# Patient Record
Sex: Male | Born: 1970
Health system: Southern US, Community
[De-identification: ages and names within clinical notes are randomized; demographics above are authoritative.]

## PROBLEM LIST (undated history)

## (undated) DIAGNOSIS — E7800 Pure hypercholesterolemia, unspecified: Secondary | ICD-10-CM

## (undated) HISTORY — PX: APPENDECTOMY: SHX54

---

## 2009-05-27 ENCOUNTER — Encounter: Payer: Self-pay | Admitting: Family Medicine

## 2009-05-27 ENCOUNTER — Ambulatory Visit: Payer: Self-pay | Admitting: Family Medicine

## 2009-05-27 DIAGNOSIS — J029 Acute pharyngitis, unspecified: Secondary | ICD-10-CM | POA: Insufficient documentation

## 2009-05-27 LAB — CONVERTED CEMR LAB: Rapid Strep: NEGATIVE

## 2010-07-17 NOTE — Assessment & Plan Note (Signed)
Summary: sore throat,cough,fatigue   Vital Signs:  Patient Profile:   40 Years Old Male CC:      cough, sorethroat and fatigue O2 Sat:      99 % O2 treatment:    Room Air Temp:     98.5 degrees F oral Pulse rate:   97 / minute Pulse rhythm:   regular Resp:     16 per minute BP sitting:   124 / 78  (right arm)  Vitals Entered By: Lannie Fields (May 27, 2009 2:40 PM)                  Updated Prior Medication List: No Medications Current Allergies: No known allergies History of Present Illness Chief Complaint: cough, sorethroat and fatigue History of Present Illness: Subjective: Patient complains of sore throat that started this morning.  He noticed that his uvula is slightly swollen + slight cough No pleuritic pain No wheezing No nasal congestion No post-nasal drainage No sinus pain/pressure No itchy/red eyes No earache No hemoptysis No SOB No fever, + chills No nausea No vomiting No abdominal pain No diarrhea No skin rashes + fatigue No myalgias No headache    REVIEW OF SYSTEMS Constitutional Symptoms       Complains of chills and fatigue.     Denies fever, night sweats, weight loss, and weight gain.  Eyes       Denies change in vision, eye pain, eye discharge, glasses, contact lenses, and eye surgery. Ear/Nose/Throat/Mouth       Complains of sore throat and hoarseness.      Denies hearing loss/aids, change in hearing, ear pain, ear discharge, dizziness, frequent runny nose, frequent nose bleeds, sinus problems, and tooth pain or bleeding.  Respiratory       Complains of dry cough.      Denies productive cough, wheezing, shortness of breath, asthma, bronchitis, and emphysema/COPD.  Cardiovascular       Denies murmurs, chest pain, and tires easily with exhertion.    Gastrointestinal       Denies stomach pain, nausea/vomiting, diarrhea, constipation, blood in bowel movements, and indigestion. Genitourniary       Denies painful urination, kidney  stones, and loss of urinary control. Neurological       Complains of headaches.      Denies paralysis, seizures, and fainting/blackouts. Musculoskeletal       Denies muscle pain, joint pain, joint stiffness, decreased range of motion, redness, swelling, muscle weakness, and gout.  Skin       Denies bruising, unusual mles/lumps or sores, and hair/skin or nail changes.  Psych       Denies mood changes, temper/anger issues, anxiety/stress, speech problems, depression, and sleep problems.  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  Extraocular movement is intact.  Conjunctivae are not inflamed.  Ears:  Canals normal.  Tympanic membranes normal.   Nose:  Normal septum.  Normal turbinates, mildly congested.   No sinus tenderness present.  Pharynx:  Mildly erythematous.  No exudate Neck:  Supple.  Slightly tender shotty anterior/posterior nodes are palpated bilaterally.  Lungs:  Clear to auscultation.  Breath sounds are equal.  Heart:  Regular rate and rhythm without murmurs, rubs, or gallops.  Abdomen:  Nontender without masses or hepatosplenomegaly.  Bowel sounds are present.  No CVA or flank tenderness.  Skin:  No rash Rapid strep test negative  Assessment New Problems: ACUTE PHARYNGITIS (ICD-462)  ? non group A strep vs early viral URI  Plan New Medications/Changes: AZITHROMYCIN 250 MG TABS (AZITHROMYCIN) Two tabs by mouth on day 1, then 1 tab daily on days 2 through 5  #6 tabs x 0, 05/27/2009, Donna Christen MD  New Orders: New Patient Level III [99203] Rapid Strep [87880] T-Culture, Throat  [[id number]] Planning Comments:   Throat culture pending.  If positive, begin Z-pack.  Treat symptomatically for now with Aleve, salt water gargles   The patient and/or caregiver has been counseled thoroughly with regard to medications prescribed including dosage, schedule, interactions, rationale for use, and possible side effects and they verbalize understanding.  Diagnoses and expected course of recovery discussed and will return if not improved as expected or if the condition worsens. Patient and/or caregiver verbalized understanding.  Prescriptions: AZITHROMYCIN 250 MG TABS (AZITHROMYCIN) Two tabs by mouth on day 1, then 1 tab daily on days 2 through 5  #6 tabs x 0   Entered and Authorized by:   Donna Christen MD   Signed by:   Donna Christen MD on 05/27/2009   Method used:   Print then Give to Patient   RxID:   [id number]   Patient Instructions: 1)  May use Mucinex D (guaifenesin with decongestant) twice daily for congestion. 2)  May take 2 Aleve tabs every 12 hours. 3)  Increase fluid intake, rest. 4)  May use Afrin nasal spray (or generic oxymetazoline) twice daily for about 5 days.  Also recommend using saline nasal spray several times daily and/or saline nasal irrigation. 5)  Followup with family doctor if not improving 7 to 10 days  Laboratory Results  Date/Time Received: Lannie Fields  May 27, 2009 3:18 PM  Date/Time Reported: Lannie Fields  May 27, 2009 3:18 PM   Other Tests  Rapid Strep: negative  Kit Test Internal QC: Negative   (Normal Range: Negative)

## 2010-07-17 NOTE — Letter (Signed)
Summary: Handout Printed  Printed Handout:  - Rheumatic Fever 

## 2011-06-10 ENCOUNTER — Encounter: Payer: Self-pay | Admitting: *Deleted

## 2011-06-10 ENCOUNTER — Emergency Department
Admission: EM | Admit: 2011-06-10 | Discharge: 2011-06-10 | Disposition: A | Discharge status: Home / Self Care | Payer: 59 | Source: Home / Self Care | Attending: Emergency Medicine | Admitting: Emergency Medicine

## 2011-06-10 DIAGNOSIS — J209 Acute bronchitis, unspecified: Secondary | ICD-10-CM

## 2011-06-10 MED ORDER — CEFTRIAXONE SODIUM 1 G IJ SOLR
1.0000 g | INTRAMUSCULAR | Status: AC
  Administered 2011-06-10: 1 g via INTRAMUSCULAR

## 2011-06-10 MED ORDER — LEVOFLOXACIN 500 MG PO TABS: ORAL_TABLET | ORAL | Status: AC

## 2011-06-10 NOTE — ED Provider Notes (Signed)
History     CSN: [id number]  Arrival date & time 06/10/11  1024   First MD Initiated Contact with Patient 06/10/11 1126      Chief Complaint  Patient presents with  . Cough  . URI    HPI History of present illness:  4 days of worsening congestion and cough, associated with discolored mucus and fever. Has tried over-the-counter treatments without significant improvement.  Cough is worsening, productive of yellow-green sputum. Rarely has wheezing, not today. The fever chills or night sweats or worsening. Worsening malaise, without focal neurologic symptoms.  Review of systems: No significant sore throat.  No dyspnea. No abdominal pain, nausea or vomiting. No GU symptoms. No new rash. No syncope or focal neurologic symptoms. History reviewed. No pertinent past medical history.  History reviewed. No pertinent past surgical history.  History reviewed. No pertinent family history.  History  Substance Use Topics  . Smoking status: Never Smoker   . Smokeless tobacco: Not on file  . Alcohol Use: Yes      Review of Systems  HENT: Positive for congestion and rhinorrhea. Negative for nosebleeds and neck pain.   Eyes: Negative.   Respiratory: Negative for shortness of breath.   Cardiovascular: Negative.   Gastrointestinal: Negative.   Genitourinary: Negative.   Musculoskeletal: Negative.   Neurological: Negative.   Hematological: Negative.   Psychiatric/Behavioral: Negative.     Allergies  Review of patient's allergies indicates no known allergies.  Home Medications   Current Outpatient Rx  Name Route Sig Dispense Refill  . LEVOFLOXACIN 500 MG PO TABS  Take 1 tablet daily for 10 days. 10 tablet 0    BP 128/83  Pulse 111  Temp(Src) 99.8 F (37.7 C) (Oral)  Resp 16  Ht 6' (1.829 m)  Wt 218 lb (98.884 kg)  BMI 29.57 kg/m2  SpO2 98%  Physical Exam  Nursing note and vitals reviewed. Constitutional: He is oriented to person, place, and time. He appears  well-developed and well-nourished.  Non-toxic appearance. He appears ill. No distress.  HENT:  Head: Normocephalic and atraumatic.  Right Ear: Tympanic membrane normal.  Left Ear: Tympanic membrane normal.  Nose: Nose normal.  Mouth/Throat: Oropharynx is clear and moist. No oropharyngeal exudate.  Eyes: Right eye exhibits no discharge. Left eye exhibits no discharge. No scleral icterus.  Neck: Neck supple.  Cardiovascular: Normal rate, regular rhythm and normal heart sounds.   Pulmonary/Chest: No respiratory distress. He has no wheezes. He has rhonchi. He has no rales.  Lymphadenopathy:    He has no cervical adenopathy.  Neurological: He is alert and oriented to person, place, and time.  Skin: Skin is warm and dry.    ED Course  Procedures (including critical care time)   1. Bronchitis, acute    MDM  Workup and treatment options discussed. He declined a chest x-ray, and I agree that he has no findings compatible with pneumonia. After risks, benefits, alternatives discussed, he preferred to start aggressive treatment with 1 g Rocephin IM now. Levaquin by mouth also prescribed. Other symptomatic care discussed. See detailed Instructions in AVS, which were given to patient. Verbal instructions also given. Questions invited and answered.        Lonell Face, MD 06/10/11 2030

## 2011-06-10 NOTE — ED Notes (Signed)
Patient c/o cough, congestion and sore throat x 4 days. He did not receive a flu shot this year.

## 2014-10-11 ENCOUNTER — Encounter: Payer: Self-pay | Admitting: Physician Assistant

## 2014-10-11 ENCOUNTER — Ambulatory Visit (INDEPENDENT_AMBULATORY_CARE_PROVIDER_SITE_OTHER): Payer: BLUE CROSS/BLUE SHIELD | Admitting: Physician Assistant

## 2014-10-11 VITALS — BP 132/90 | HR 87 | Temp 98.4°F | Resp 20 | Ht 71.0 in | Wt 228.0 lb

## 2014-10-11 DIAGNOSIS — Z13220 Encounter for screening for lipoid disorders: Secondary | ICD-10-CM | POA: Diagnosis not present

## 2014-10-11 DIAGNOSIS — Z0000 Encounter for general adult medical examination without abnormal findings: Secondary | ICD-10-CM | POA: Diagnosis not present

## 2014-10-11 DIAGNOSIS — E6609 Other obesity due to excess calories: Secondary | ICD-10-CM | POA: Insufficient documentation

## 2014-10-11 DIAGNOSIS — Z131 Encounter for screening for diabetes mellitus: Secondary | ICD-10-CM

## 2014-10-11 DIAGNOSIS — Z6832 Body mass index (BMI) 32.0-32.9, adult: Secondary | ICD-10-CM

## 2014-10-11 DIAGNOSIS — Z23 Encounter for immunization: Secondary | ICD-10-CM

## 2014-10-11 DIAGNOSIS — J302 Other seasonal allergic rhinitis: Secondary | ICD-10-CM

## 2014-10-11 DIAGNOSIS — E669 Obesity, unspecified: Secondary | ICD-10-CM

## 2014-10-11 DIAGNOSIS — Z6833 Body mass index (BMI) 33.0-33.9, adult: Secondary | ICD-10-CM | POA: Insufficient documentation

## 2014-10-11 DIAGNOSIS — R635 Abnormal weight gain: Secondary | ICD-10-CM | POA: Diagnosis not present

## 2014-10-11 NOTE — Progress Notes (Signed)
Subjective:    Patient ID: Jonathan Shaw, male    DOB: Aug 23, 1970, 44 y.o.   MRN: [id number]  HPI Patient is a 44 year old male who presents to the clinic to establish care. 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  .. Active Ambulatory Problems    Diagnosis Date Noted  . Seasonal allergies 10/11/2014  . Obesity 10/11/2014  . Abnormal weight gain 10/11/2014   Resolved Ambulatory Problems    Diagnosis Date Noted  . Acute pharyngitis 05/27/2009   No Additional Past Medical History   Unknown family hx  .Marland Kitchen History   Social History  . Marital Status: Married    Spouse Name: N/A  . Number of Children: N/A  . Years of Education: N/A   Occupational History  . Not on file.   Social History Main Topics  . Smoking status: Never Smoker   . Smokeless tobacco: Not on file  . Alcohol Use: Yes  . Drug Use: No  . Sexual Activity: Not on file   Other Topics Concern  . Not on file   Social History Narrative      Review of Systems  All other systems reviewed and are negative.      Objective:   Physical Exam BP 132/90 mmHg  Pulse 87  Temp(Src) 98.4 F (36.9 C) (Oral)  Resp 20  Ht 5\' 11"  (1.803 m)  Wt 228 lb (103.42 kg)  BMI 31.81 kg/m2  SpO2 94%  General Appearance:    Alert, cooperative, no distress, appears stated age  Head:    Normocephalic, without obvious abnormality, atraumatic  Eyes:    PERRL, conjunctiva/corneas clear, EOM's intact, fundi    benign, both eyes       Ears:    Normal TM's and external ear canals, both ears  Nose:   Nares normal, septum midline, mucosa normal, no drainage    or sinus tenderness  Throat:   Lips, mucosa, and tongue normal; teeth and gums normal  Neck:   Supple, symmetrical, trachea midline, no adenopathy;       thyroid:  No enlargement/tenderness/nodules; no carotid   bruit or JVD  Back:     Symmetric, no curvature, ROM normal, no CVA tenderness  Lungs:     Clear to auscultation  bilaterally, respirations unlabored  Chest wall:    No tenderness or deformity  Heart:    Regular rate and rhythm, S1 and S2 normal, no murmur, rub   or gallop  Abdomen:     Soft, non-tender, bowel sounds active all four quadrants,    no masses, no organomegaly  Genitalia:  Not done.   Rectal:  Not done.   Extremities:   Extremities normal, atraumatic, no cyanosis or edema  Pulses:   2+ and symmetric all extremities  Skin:   Skin color, texture, turgor normal, no rashes or lesions  Lymph nodes:   Cervical, supraclavicular, and axillary nodes normal  Neurologic:   CNII-XII intact. Normal strength, sensation and reflexes      throughout         Assessment & Plan:  cpe- Tdap given today. Lipid and cmp ordered. No vaccines are needed at this time. Handout was given. Patient is a 32 on a multivitamin. AUA testing was 1 today. Follow-up as needed.  Obesity abnormal weight gain- certainly discussed healthy diet and exercise. Patient does appear to exercise regularly. There are medicines that can help with patient isn't interested. Patient admits  to living his carbs. Encouraged to add more protein to his diet. Will order a TSH just to make sure his thyroid function is normal.

## 2014-10-11 NOTE — Patient Instructions (Signed)

## 2014-10-12 LAB — COMPLETE METABOLIC PANEL WITH GFR
ALT: 26 U/L (ref 0–53)
AST: 19 U/L (ref 0–37)
Albumin: 4.7 g/dL (ref 3.5–5.2)
Alkaline Phosphatase: 76 U/L (ref 39–117)
BUN: 10 mg/dL (ref 6–23)
CO2: 22 mEq/L (ref 19–32)
Calcium: 9.7 mg/dL (ref 8.4–10.5)
Chloride: 101 mEq/L (ref 96–112)
Creat: 0.78 mg/dL (ref 0.50–1.35)
GFR, Est African American: >89 mL/min
GFR, Est Non African American: >89 mL/min
Glucose, Bld: 97 mg/dL (ref 70–99)
Potassium: 4.7 mEq/L (ref 3.5–5.3)
Sodium: 137 mEq/L (ref 135–145)
Total Bilirubin: 0.7 mg/dL (ref 0.2–1.2)
Total Protein: 7.4 g/dL (ref 6.0–8.3)

## 2014-10-12 LAB — LIPID PANEL
Cholesterol: 236 mg/dL — ABNORMAL HIGH (ref 0–200)
HDL: 41 mg/dL (ref >=40)
LDL Cholesterol: 162 mg/dL — ABNORMAL HIGH (ref 0–99)
Total CHOL/HDL Ratio: 5.8 Ratio
Triglycerides: 167 mg/dL — ABNORMAL HIGH (ref <150)
VLDL: 33 mg/dL (ref 0–40)

## 2014-10-12 LAB — TSH: TSH: 1.11 u[IU]/mL (ref 0.350–4.500)

## 2014-10-14 ENCOUNTER — Encounter: Payer: Self-pay | Admitting: Physician Assistant

## 2014-10-14 DIAGNOSIS — E785 Hyperlipidemia, unspecified: Secondary | ICD-10-CM | POA: Insufficient documentation

## 2015-10-11 ENCOUNTER — Encounter: Payer: Self-pay | Admitting: Physician Assistant

## 2015-10-11 ENCOUNTER — Ambulatory Visit (INDEPENDENT_AMBULATORY_CARE_PROVIDER_SITE_OTHER): Payer: BLUE CROSS/BLUE SHIELD | Admitting: Physician Assistant

## 2015-10-11 VITALS — BP 136/80 | HR 92 | Wt 237.0 lb

## 2015-10-11 DIAGNOSIS — Z131 Encounter for screening for diabetes mellitus: Secondary | ICD-10-CM | POA: Diagnosis not present

## 2015-10-11 DIAGNOSIS — E669 Obesity, unspecified: Secondary | ICD-10-CM

## 2015-10-11 DIAGNOSIS — Z0000 Encounter for general adult medical examination without abnormal findings: Secondary | ICD-10-CM

## 2015-10-11 DIAGNOSIS — Z114 Encounter for screening for human immunodeficiency virus [HIV]: Secondary | ICD-10-CM | POA: Diagnosis not present

## 2015-10-11 DIAGNOSIS — E785 Hyperlipidemia, unspecified: Secondary | ICD-10-CM | POA: Diagnosis not present

## 2015-10-11 NOTE — Progress Notes (Signed)
   Subjective:    Patient ID: Jonathan Shaw, male    DOB: June 03, 1971, 45 y.o.   MRN: [id number]  HPI Pt is a 45 yo male who presents to the clinic for CPE. No problems. He is concerned with his weight. He is walking and very active. He certainly does not watch his diet. He admits to eating a lot of sweets.   .. Active Ambulatory Problems    Diagnosis Date Noted  . Seasonal allergies 10/11/2014  . Obesity 10/11/2014  . Abnormal weight gain 10/11/2014  . Hyperlipidemia 10/14/2014   Resolved Ambulatory Problems    Diagnosis Date Noted  . Acute pharyngitis 05/27/2009   No Additional Past Medical History   .Marland Kitchen Social History   Social History  . Marital Status: Married    Spouse Name: N/A  . Number of Children: N/A  . Years of Education: N/A   Occupational History  . Not on file.   Social History Main Topics  . Smoking status: Never Smoker   . Smokeless tobacco: Not on file  . Alcohol Use: Yes  . Drug Use: No  . Sexual Activity: Not on file   Other Topics Concern  . Not on file   Social History Narrative      Review of Systems  All other systems reviewed and are negative.      Objective:   Physical Exam BP 136/80 mmHg  Pulse 92  Wt 237 lb (107.502 kg)  SpO2 97%  General Appearance:    Alert, cooperative, no distress, appears stated age  Head:    Normocephalic, without obvious abnormality, atraumatic  Eyes:    PERRL, conjunctiva/corneas clear, EOM's intact, fundi    benign, both eyes       Ears:    Normal TM's and external ear canals, both ears  Nose:   Nares normal, septum midline, mucosa normal, no drainage    or sinus tenderness  Throat:   Lips, mucosa, and tongue normal; teeth and gums normal  Neck:   Supple, symmetrical, trachea midline, no adenopathy;       thyroid:  No enlargement/tenderness/nodules; no carotid   bruit or JVD  Back:     Symmetric, no curvature, ROM normal, no CVA tenderness  Lungs:     Clear to auscultation bilaterally,  respirations unlabored  Chest wall:    No tenderness or deformity  Heart:    Regular rate and rhythm, S1 and S2 normal, no murmur, rub   or gallop  Abdomen:     Soft, non-tender, bowel sounds active all four quadrants,    no masses, no organomegaly  Genitalia:   Not done.   Rectal:  Not done.   Extremities:   Extremities normal, atraumatic, no cyanosis or edema  Pulses:   2+ and symmetric all extremities  Skin:   Skin color, texture, turgor normal, no rashes or lesions  Lymph nodes:   Cervical, supraclavicular, and axillary nodes normal  Neurologic:   CNII-XII intact. Normal strength, sensation and reflexes      throughout         Assessment & Plan:  CPE/hyperlipidemia- lipid and cmp ordered. TSH and PSA ordered. AUA was 0. See weight discussion. HIV screening ordered.   Obesity- encouraged diet changes. Discussed medication options. Pt declined any help today.

## 2015-10-11 NOTE — Patient Instructions (Signed)

## 2015-10-12 LAB — PSA: PSA: 1.05 ng/mL (ref <=4.00)

## 2015-10-12 LAB — LIPID PANEL
Cholesterol: 232 mg/dL — ABNORMAL HIGH (ref 125–200)
HDL: 39 mg/dL — ABNORMAL LOW (ref >=40)
LDL Cholesterol: 158 mg/dL — ABNORMAL HIGH (ref <130)
Total CHOL/HDL Ratio: 5.9 Ratio — ABNORMAL HIGH (ref <=5.0)
Triglycerides: 176 mg/dL — ABNORMAL HIGH (ref <150)
VLDL: 35 mg/dL — ABNORMAL HIGH (ref <30)

## 2015-10-12 LAB — COMPLETE METABOLIC PANEL WITH GFR
ALT: 32 U/L (ref 9–46)
AST: 24 U/L (ref 10–40)
Albumin: 4.8 g/dL (ref 3.6–5.1)
Alkaline Phosphatase: 66 U/L (ref 40–115)
BUN: 12 mg/dL (ref 7–25)
CO2: 25 mmol/L (ref 20–31)
Calcium: 10 mg/dL (ref 8.6–10.3)
Chloride: 101 mmol/L (ref 98–110)
Creat: 0.87 mg/dL (ref 0.60–1.35)
GFR, Est African American: >89 mL/min (ref >=60)
GFR, Est Non African American: >89 mL/min (ref >=60)
Glucose, Bld: 99 mg/dL (ref 65–99)
Potassium: 4.4 mmol/L (ref 3.5–5.3)
Sodium: 138 mmol/L (ref 135–146)
Total Bilirubin: 1.2 mg/dL (ref 0.2–1.2)
Total Protein: 7.4 g/dL (ref 6.1–8.1)

## 2015-10-12 LAB — TSH: TSH: 1.53 mIU/L (ref 0.40–4.50)

## 2015-10-13 ENCOUNTER — Encounter: Payer: Self-pay | Admitting: Physician Assistant

## 2015-10-13 DIAGNOSIS — E785 Hyperlipidemia, unspecified: Secondary | ICD-10-CM | POA: Insufficient documentation

## 2015-10-13 LAB — HIV ANTIBODY (ROUTINE TESTING W REFLEX): HIV 1&2 Ab, 4th Generation: NONREACTIVE

## 2015-10-16 ENCOUNTER — Other Ambulatory Visit: Payer: Self-pay | Admitting: *Deleted

## 2015-10-16 MED ORDER — ATORVASTATIN CALCIUM 40 MG PO TABS: 40.0000 mg | ORAL_TABLET | Freq: Every day | ORAL | Status: DC

## 2015-10-16 MED ORDER — PHENTERMINE HCL 37.5 MG PO CAPS: 37.5000 mg | ORAL_CAPSULE | ORAL | Status: DC

## 2015-11-21 ENCOUNTER — Ambulatory Visit (INDEPENDENT_AMBULATORY_CARE_PROVIDER_SITE_OTHER): Payer: BLUE CROSS/BLUE SHIELD | Admitting: Physician Assistant

## 2015-11-21 ENCOUNTER — Encounter: Payer: Self-pay | Admitting: Physician Assistant

## 2015-11-21 VITALS — BP 123/90 | HR 90 | Wt 229.0 lb

## 2015-11-21 DIAGNOSIS — M79673 Pain in unspecified foot: Secondary | ICD-10-CM | POA: Diagnosis not present

## 2015-11-21 DIAGNOSIS — E669 Obesity, unspecified: Secondary | ICD-10-CM

## 2015-11-21 DIAGNOSIS — K5900 Constipation, unspecified: Secondary | ICD-10-CM | POA: Diagnosis not present

## 2015-11-21 DIAGNOSIS — G479 Sleep disorder, unspecified: Secondary | ICD-10-CM

## 2015-11-21 MED ORDER — PHENTERMINE HCL 37.5 MG PO TABS: 37.5000 mg | ORAL_TABLET | Freq: Every day | ORAL | Status: DC

## 2015-11-21 NOTE — Progress Notes (Signed)
   Subjective:    Patient ID: Jonathan Shaw, male    DOB: [birth year], 45 y.o.   MRN: [id number]  HPI Pt is a 45 yo male who presents to the clinic for weight check after starting phentermine. He has lost 8lbs. He denies and palpitations. He does have some difficulty going to sleep and constipation. Not currently taking phentermine daily or taking anything for constipation.   He has also had 2 episodes of bilateral heel pain for 1 day. No ongoing symptoms. Pain did start after being on his feet for over 24 hours. He works as a Engineer, structural. Not taking anything. Heels do not hurt today. He wonders if could be lipitor.    Review of Systems  All other systems reviewed and are negative.      Objective:   Physical Exam  Constitutional: He is oriented to person, place, and time. He appears well-developed and well-nourished.  HENT:  Head: Normocephalic and atraumatic.  Cardiovascular: Normal rate, regular rhythm and normal heart sounds.   Pulmonary/Chest: Effort normal and breath sounds normal. He has no wheezes.  Musculoskeletal:  No tenderness to palpation over heels or fascia.  Normal plantar and dorsiflexion and extension.   Neurological: He is alert and oriented to person, place, and time.  Psychiatric: He has a normal mood and affect. His behavior is normal.          Assessment & Plan:  Abnormal weight gain/obesity- refilled phentermine. Discussed to try 1/2 tablet and see if sleep improves. Continue diet and exercise. Colace/miralax for contstipation as needed. Like due to medication. Follow up in one month.   Heel pain- likely due to overuse. Discussed after long days to use frozen water bottle to ice and ibuprofen. Not likely lipitor or phentermine. Follow up if continues and more constant.

## 2015-12-18 ENCOUNTER — Ambulatory Visit (INDEPENDENT_AMBULATORY_CARE_PROVIDER_SITE_OTHER): Payer: 59 | Admitting: Physician Assistant

## 2015-12-18 ENCOUNTER — Telehealth: Payer: Self-pay | Admitting: *Deleted

## 2015-12-18 VITALS — BP 144/96 | HR 86 | Wt 229.0 lb

## 2015-12-18 DIAGNOSIS — E669 Obesity, unspecified: Secondary | ICD-10-CM | POA: Diagnosis not present

## 2015-12-18 DIAGNOSIS — R635 Abnormal weight gain: Secondary | ICD-10-CM

## 2015-12-18 MED ORDER — PHENTERMINE HCL 37.5 MG PO TABS: 37.5000 mg | ORAL_TABLET | Freq: Every day | ORAL | Status: DC

## 2015-12-18 NOTE — Telephone Encounter (Signed)
-----   Message from Donella Stade, [place] sent at 12/18/2015  9:59 AM EDT ----- Does he want to stay on it and work on weight loss meaning take daily and exercise regularly and count calories?

## 2015-12-18 NOTE — Telephone Encounter (Signed)
Patient will stay on phentermine daily and exercise and count calories

## 2015-12-18 NOTE — Progress Notes (Signed)
   Subjective:    Patient ID: Jonathan Shaw, male    DOB: Jan 20, 1971, 45 y.o.   MRN: [id number]  HPI Review of Systems    Patient doing well on appetite suppressant.  Here for nurse visit, weight, BP, HR check.  Denies problems with insomnia, heart palpitations or tremors.  Satisfied with weight loss thus far and is working on [employer] and regular exercise.  Patient states he has not been consistant in taking the phentermine. Sometimes he takes a half tablet sometimes not. Patient states he really has not had to use Colace or Miralax. Patient did not gain weight nor did he lose weight.    Objective:   Physical Exam        Assessment & Plan:

## 2016-01-15 ENCOUNTER — Ambulatory Visit (INDEPENDENT_AMBULATORY_CARE_PROVIDER_SITE_OTHER): Payer: 59 | Admitting: Physician Assistant

## 2016-01-15 VITALS — BP 120/82 | HR 105 | Wt 224.0 lb

## 2016-01-15 DIAGNOSIS — R000 Tachycardia, unspecified: Secondary | ICD-10-CM | POA: Diagnosis not present

## 2016-01-15 DIAGNOSIS — E669 Obesity, unspecified: Secondary | ICD-10-CM | POA: Diagnosis not present

## 2016-01-15 DIAGNOSIS — R635 Abnormal weight gain: Secondary | ICD-10-CM

## 2016-01-15 MED ORDER — PHENTERMINE HCL 37.5 MG PO TABS: 37.5000 mg | ORAL_TABLET | Freq: Every day | ORAL | 0 refills | Status: DC

## 2016-01-15 NOTE — Progress Notes (Addendum)
Patient is here for blood pressure and weight check. Denies any trouble sleeping, palpitations, or any other medication problems. Patient has lost weight. A refill for Phentermine will be sent to patient preferred pharmacy. Patient advised to schedule a four week nurse visit and keep his upcoming appointment with her PCP. Verbalized understanding, no further questions.   Pt was tachycardic. If continues to be at next visit may need to consider 1/2 tablet. Iran Planas PA-C

## 2016-02-15 ENCOUNTER — Ambulatory Visit: Payer: 59

## 2016-02-16 ENCOUNTER — Ambulatory Visit (INDEPENDENT_AMBULATORY_CARE_PROVIDER_SITE_OTHER): Payer: 59 | Admitting: Physician Assistant

## 2016-02-16 VITALS — BP 120/80 | HR 96 | Wt 220.0 lb

## 2016-02-16 DIAGNOSIS — R635 Abnormal weight gain: Secondary | ICD-10-CM | POA: Diagnosis not present

## 2016-02-16 DIAGNOSIS — E785 Hyperlipidemia, unspecified: Secondary | ICD-10-CM

## 2016-02-16 MED ORDER — PHENTERMINE HCL 37.5 MG PO TABS: 37.5000 mg | ORAL_TABLET | Freq: Every day | ORAL | 0 refills | Status: DC

## 2016-02-16 NOTE — Progress Notes (Signed)
   Subjective:    Patient ID: Jonathan Shaw, male    DOB: 26-Aug-1970, 45 y.o.   MRN: [id number]  HPI  LARRELL FREDIANI is here for blood pressure and weight check. Diet and exercise is going well. Denies trouble sleeping or palpitations.    Review of Systems     Objective:   Physical Exam        Assessment & Plan:  Abnormal weight gain- Brandom has lost weight. A refill of the phentermine will be faxed to the pharmacy later today. Patient advised to follow up in 1 month for blood pressure and weight check.

## 2016-02-21 ENCOUNTER — Encounter: Payer: Self-pay | Admitting: Physician Assistant

## 2016-02-21 ENCOUNTER — Telehealth: Payer: Self-pay | Admitting: *Deleted

## 2016-02-21 DIAGNOSIS — R7301 Impaired fasting glucose: Secondary | ICD-10-CM | POA: Insufficient documentation

## 2016-02-21 LAB — COMPLETE METABOLIC PANEL WITH GFR
ALT: 24 U/L (ref 9–46)
AST: 18 U/L (ref 10–40)
Albumin: 4.5 g/dL (ref 3.6–5.1)
Alkaline Phosphatase: 83 U/L (ref 40–115)
BUN: 11 mg/dL (ref 7–25)
CO2: 24 mmol/L (ref 20–31)
Calcium: 9.7 mg/dL (ref 8.6–10.3)
Chloride: 103 mmol/L (ref 98–110)
Creat: 0.8 mg/dL (ref 0.60–1.35)
GFR, Est African American: >89 mL/min (ref >=60)
GFR, Est Non African American: >89 mL/min (ref >=60)
Glucose, Bld: 106 mg/dL — ABNORMAL HIGH (ref 65–99)
Potassium: 4.4 mmol/L (ref 3.5–5.3)
Sodium: 138 mmol/L (ref 135–146)
Total Bilirubin: 0.6 mg/dL (ref 0.2–1.2)
Total Protein: 7.2 g/dL (ref 6.1–8.1)

## 2016-02-21 LAB — LIPID PANEL
Cholesterol: 129 mg/dL (ref 125–200)
HDL: 48 mg/dL (ref >=40)
LDL Cholesterol: 62 mg/dL (ref <130)
Total CHOL/HDL Ratio: 2.7 Ratio (ref <=5.0)
Triglycerides: 94 mg/dL (ref <150)
VLDL: 19 mg/dL (ref <30)

## 2016-02-21 NOTE — Telephone Encounter (Signed)
A1c ordered.

## 2016-02-22 LAB — HEMOGLOBIN A1C
Hgb A1c MFr Bld: 5.1 % (ref <5.7)
Mean Plasma Glucose: 100 mg/dL

## 2016-03-15 ENCOUNTER — Encounter: Payer: Self-pay | Admitting: Physician Assistant

## 2016-03-15 ENCOUNTER — Ambulatory Visit (INDEPENDENT_AMBULATORY_CARE_PROVIDER_SITE_OTHER): Payer: 59 | Admitting: Physician Assistant

## 2016-03-15 VITALS — BP 129/90 | HR 88 | Wt 223.0 lb

## 2016-03-15 DIAGNOSIS — E669 Obesity, unspecified: Secondary | ICD-10-CM

## 2016-03-15 DIAGNOSIS — E784 Other hyperlipidemia: Secondary | ICD-10-CM

## 2016-03-15 DIAGNOSIS — R635 Abnormal weight gain: Secondary | ICD-10-CM

## 2016-03-15 DIAGNOSIS — E785 Hyperlipidemia, unspecified: Secondary | ICD-10-CM

## 2016-03-15 MED ORDER — ATORVASTATIN CALCIUM 40 MG PO TABS: 40.0000 mg | ORAL_TABLET | Freq: Every day | ORAL | 2 refills | Status: DC

## 2016-03-15 MED ORDER — PHENTERMINE HCL 37.5 MG PO TABS: 37.5000 mg | ORAL_TABLET | Freq: Every day | ORAL | 0 refills | Status: DC

## 2016-03-15 NOTE — Progress Notes (Signed)
   Subjective:    Patient ID: Jonathan Shaw, male    DOB: [birth year], 45 y.o.   MRN: [id number]  HPI  Pt is a 45 yo male who presents to the clinic to follow up on weigh and taking phentermine. He is tolerating phentermine well. He denies any CP, palpitations, dizziness, or insomnia. He is walking about 1 and 1/2 miles a day and in the gym a few times a week.   He would like to go over labs.    Review of Systems See HPI.     Objective:   Physical Exam  Constitutional: He is oriented to person, place, and time. He appears well-developed and well-nourished.  HENT:  Head: Normocephalic and atraumatic.  Cardiovascular: Normal rate, regular rhythm and normal heart sounds.   Pulmonary/Chest: Effort normal and breath sounds normal.  Neurological: He is alert and oriented to person, place, and time.  Psychiatric: He has a normal mood and affect. His behavior is normal.          Assessment & Plan:  Obesity/abnormal weight gain- refilled phentermine. 60 tablets given to use over the next 2-3 months. Continue with diet plan and exercise. Goal is to lose 13 more pounds.   Dyslipidemia- discussed labs with patient. Doing great on Lipitor. Refilled today. Continue to work on weight loss.

## 2016-09-09 ENCOUNTER — Other Ambulatory Visit: Payer: Self-pay | Admitting: *Deleted

## 2016-09-09 MED ORDER — ATORVASTATIN CALCIUM 40 MG PO TABS: 40.0000 mg | ORAL_TABLET | Freq: Every day | ORAL | 1 refills | Status: DC

## 2016-10-14 ENCOUNTER — Other Ambulatory Visit: Payer: Self-pay | Admitting: Physician Assistant

## 2016-10-14 ENCOUNTER — Ambulatory Visit (INDEPENDENT_AMBULATORY_CARE_PROVIDER_SITE_OTHER): Payer: 59 | Admitting: Physician Assistant

## 2016-10-14 ENCOUNTER — Encounter: Payer: Self-pay | Admitting: Physician Assistant

## 2016-10-14 VITALS — BP 138/74 | HR 84 | Ht 71.0 in | Wt 231.0 lb

## 2016-10-14 DIAGNOSIS — Z131 Encounter for screening for diabetes mellitus: Secondary | ICD-10-CM

## 2016-10-14 DIAGNOSIS — E785 Hyperlipidemia, unspecified: Secondary | ICD-10-CM

## 2016-10-14 DIAGNOSIS — Z0000 Encounter for general adult medical examination without abnormal findings: Secondary | ICD-10-CM | POA: Diagnosis not present

## 2016-10-14 MED ORDER — ATORVASTATIN CALCIUM 40 MG PO TABS: 40.0000 mg | ORAL_TABLET | Freq: Every day | ORAL | 1 refills | Status: DC

## 2016-10-14 NOTE — Patient Instructions (Signed)

## 2016-10-14 NOTE — Progress Notes (Signed)
Subjective:    Patient ID: Jonathan Shaw, male    DOB: 1970/07/02, 46 y.o.   MRN: [id number]  HPI Pt is a 46 yo male who presents to the clinic for CPE.  He has no concerns or complaints.   .. Active Ambulatory Problems    Diagnosis Date Noted  . Seasonal allergies 10/11/2014  . Obesity 10/11/2014  . Abnormal weight gain 10/11/2014  . Hyperlipidemia 10/14/2014  . Dyslipidemia (high LDL; low HDL) 10/13/2015  . Constipation 11/21/2015  . Heel pain 11/21/2015  . Elevated fasting glucose 02/21/2016   Resolved Ambulatory Problems    Diagnosis Date Noted  . Acute pharyngitis 05/27/2009   No Additional Past Medical History   . Social History   Social History  . Marital status: Married    Spouse name: N/A  . Number of children: N/A  . Years of education: N/A   Occupational History  . Not on file.   Social History Main Topics  . Smoking status: Never Smoker  . Smokeless tobacco: Never Used  . Alcohol use Yes  . Drug use: No  . Sexual activity: Not on file   Other Topics Concern  . Not on file   Social History Narrative  . No narrative on file      Review of Systems  All other systems reviewed and are negative.      Objective:   Physical Exam BP 138/74   Pulse 84   Ht 5\' 11"  (1.803 m)   Wt 231 lb (104.8 kg)   BMI 32.22 kg/m   General Appearance:    Alert, cooperative, no distress, appears stated age  Head:    Normocephalic, without obvious abnormality, atraumatic  Eyes:    PERRL, conjunctiva/corneas clear, EOM's intact, fundi    benign, both eyes       Ears:    Normal TM's and external ear canals, both ears  Nose:   Nares normal, septum midline, mucosa normal, no drainage    or sinus tenderness  Throat:   Lips, mucosa, and tongue normal; teeth and gums normal  Neck:   Supple, symmetrical, trachea midline, no adenopathy;       thyroid:  No enlargement/tenderness/nodules; no carotid   bruit or JVD  Back:     Symmetric, no curvature, ROM normal,  no CVA tenderness  Lungs:     Clear to auscultation bilaterally, respirations unlabored  Chest wall:    No tenderness or deformity  Heart:    Regular rate and rhythm, S1 and S2 normal, no murmur, rub   or gallop  Abdomen:     Soft, non-tender, bowel sounds active all four quadrants,    no masses, no organomegaly        Extremities:   Extremities normal, atraumatic, no cyanosis or edema  Pulses:   2+ and symmetric all extremities  Skin:   Skin color, texture, turgor normal, no rashes or lesions  Lymph nodes:   Cervical, supraclavicular, and axillary nodes normal  Neurologic:   CNII-XII intact. Normal strength, sensation and reflexes      throughout         Assessment & Plan:  .Marland KitchenHumza was seen today for annual exam.  Diagnoses and all orders for this visit:  Routine physical examination -     COMPLETE METABOLIC PANEL WITH GFR  Screening for diabetes mellitus -     COMPLETE METABOLIC PANEL WITH GFR  Dyslipidemia (high LDL; low HDL) -     atorvastatin (LIPITOR)  40 MG tablet; Take 1 tablet (40 mg total) by mouth daily.   Pt had great lipid panel just over 6 months ago. Will wait until has been a year. Refills sent of lipitor.   Elevated glucose last check. Will check fasting sugar.   Discussed MVI and regular exercise.   Vaccines up to date.

## 2016-10-15 LAB — CMP 10231
AG Ratio: 1.6 Ratio (ref 1.0–2.5)
ALT: 35 U/L (ref 9–46)
AST: 20 U/L (ref 10–40)
Albumin: 4.4 g/dL (ref 3.6–5.1)
Alkaline Phosphatase: 78 U/L (ref 40–115)
BUN/Creatinine Ratio: 14.1 Ratio (ref 6–22)
BUN: 12 mg/dL (ref 7–25)
CO2: 19 mmol/L — ABNORMAL LOW (ref 20–31)
Calcium: 9.5 mg/dL (ref 8.6–10.3)
Chloride: 105 mmol/L (ref 98–110)
Creat: 0.85 mg/dL (ref 0.60–1.35)
GFR, Est African American: >89 mL/min (ref >=60)
GFR, Est Non African American: >89 mL/min (ref >=60)
Globulin: 2.7 g/dL (ref 1.9–3.7)
Glucose, Bld: 107 mg/dL — ABNORMAL HIGH (ref 65–99)
Potassium: 4.5 mmol/L (ref 3.5–5.3)
Sodium: 141 mmol/L (ref 135–146)
Total Bilirubin: 0.7 mg/dL (ref 0.2–1.2)
Total Protein: 7.1 g/dL (ref 6.1–8.1)

## 2016-10-17 ENCOUNTER — Ambulatory Visit (INDEPENDENT_AMBULATORY_CARE_PROVIDER_SITE_OTHER): Payer: 59 | Admitting: Family Medicine

## 2016-10-17 VITALS — BP 116/77 | HR 80

## 2016-10-17 DIAGNOSIS — R7309 Other abnormal glucose: Secondary | ICD-10-CM

## 2016-10-17 LAB — POCT GLYCOSYLATED HEMOGLOBIN (HGB A1C): Hemoglobin A1C: 5.3

## 2016-10-17 NOTE — Progress Notes (Signed)
Abnormal glucose-patient reassurance that hemoglobin A1c is within the normal range of 5.3. I recommend he still have this monitored once a year since he has shown have some borderline glucose levels at times. Continue to make sure eating healthy getting regular exercise and maintaining a healthy weight.  Beatrice Lecher, MD

## 2016-10-17 NOTE — Progress Notes (Signed)
Pt states he came into clinic today for a1c check. Reports he had his routine labs completed and his glucose was elevated. Advised we would contact him with any recommendations.

## 2016-10-17 NOTE — Progress Notes (Signed)
Pt advised.

## 2017-04-15 ENCOUNTER — Other Ambulatory Visit: Payer: Self-pay | Admitting: Physician Assistant

## 2017-04-15 DIAGNOSIS — E785 Hyperlipidemia, unspecified: Secondary | ICD-10-CM

## 2017-05-05 LAB — LIPID PANEL W/REFLEX DIRECT LDL
Cholesterol: 154 mg/dL (ref <200)
HDL: 44 mg/dL (ref >40)
LDL Cholesterol (Calc): 89 mg/dL (calc)
Non-HDL Cholesterol (Calc): 110 mg/dL (calc) (ref <130)
Total CHOL/HDL Ratio: 3.5 (calc) (ref <5.0)
Triglycerides: 110 mg/dL (ref <150)

## 2017-05-05 LAB — COMPLETE METABOLIC PANEL WITH GFR
AG Ratio: 2.2 (calc) (ref 1.0–2.5)
ALT: 34 U/L (ref 9–46)
AST: 20 U/L (ref 10–40)
Albumin: 4.8 g/dL (ref 3.6–5.1)
Alkaline phosphatase (APISO): 91 U/L (ref 40–115)
BUN: 12 mg/dL (ref 7–25)
CO2: 26 mmol/L (ref 20–32)
Calcium: 9.8 mg/dL (ref 8.6–10.3)
Chloride: 104 mmol/L (ref 98–110)
Creat: 0.84 mg/dL (ref 0.60–1.35)
GFR, Est African American: 123 mL/min/{1.73_m2} (ref >=60)
GFR, Est Non African American: 106 mL/min/{1.73_m2} (ref >=60)
Globulin: 2.2 g/dL (calc) (ref 1.9–3.7)
Glucose, Bld: 84 mg/dL (ref 65–99)
Potassium: 4.2 mmol/L (ref 3.5–5.3)
Sodium: 138 mmol/L (ref 135–146)
Total Bilirubin: 0.9 mg/dL (ref 0.2–1.2)
Total Protein: 7 g/dL (ref 6.1–8.1)

## 2017-05-06 ENCOUNTER — Other Ambulatory Visit: Payer: Self-pay | Admitting: Physician Assistant

## 2017-05-06 DIAGNOSIS — E785 Hyperlipidemia, unspecified: Secondary | ICD-10-CM

## 2017-05-06 MED ORDER — ATORVASTATIN CALCIUM 40 MG PO TABS: 40.0000 mg | ORAL_TABLET | Freq: Every day | ORAL | 3 refills | Status: DC

## 2017-07-05 ENCOUNTER — Other Ambulatory Visit: Payer: Self-pay | Admitting: Physician Assistant

## 2017-07-05 DIAGNOSIS — E785 Hyperlipidemia, unspecified: Secondary | ICD-10-CM

## 2017-08-18 ENCOUNTER — Emergency Department (INDEPENDENT_AMBULATORY_CARE_PROVIDER_SITE_OTHER)
Admission: EM | Admit: 2017-08-18 | Discharge: 2017-08-18 | Disposition: A | Discharge status: Home / Self Care | Payer: 59 | Source: Home / Self Care | Attending: Emergency Medicine | Admitting: Emergency Medicine

## 2017-08-18 ENCOUNTER — Encounter: Payer: Self-pay | Admitting: Emergency Medicine

## 2017-08-18 ENCOUNTER — Other Ambulatory Visit: Payer: Self-pay

## 2017-08-18 DIAGNOSIS — R100 Acute abdomen: Secondary | ICD-10-CM | POA: Diagnosis not present

## 2017-08-18 HISTORY — DX: Pure hypercholesterolemia, unspecified: E78.00

## 2017-08-18 NOTE — ED Triage Notes (Signed)
Started Saturday morning with nausea, fever Saturday night.  Has fever that started Saturday night.  Abdominal pain has been since Saturday.  It is from groin to umbilicus, and from side to side.  The pain level is from 7-10 at all times.

## 2017-08-18 NOTE — Discharge Instructions (Signed)
Please go straight to the emergency room at [facility]

## 2017-08-18 NOTE — ED Provider Notes (Addendum)
[name]    CSN: [id number] Arrival date & time: 08/18/17  2505     History   Chief Complaint Chief Complaint  Patient presents with  . Abdominal Pain  . Fever    HPI Jonathan Shaw is a 47 y.o. male.   HPI Patient presents with onset Saturday afternoon of periumbilical abdominal pain associated with fever. He has had one episode of vomiting. All through the day yesterday his pain became increasingly severe. He is had one episode of vomiting. His pain level is currently 7 out of 10. Past Medical History:  Diagnosis Date  . High cholesterol     Patient Active Problem List   Diagnosis Date Noted  . Elevated fasting glucose 02/21/2016  . Constipation 11/21/2015  . Heel pain 11/21/2015  . Dyslipidemia (high LDL; low HDL) 10/13/2015  . Hyperlipidemia 10/14/2014  . Seasonal allergies 10/11/2014  . Obesity 10/11/2014  . Abnormal weight gain 10/11/2014    History reviewed. No pertinent surgical history.     Home Medications    Prior to Admission medications   Medication Sig Start Date End Date Taking? Authorizing Provider  atorvastatin (LIPITOR) 40 MG tablet Take 1 tablet (40 mg total) by mouth daily. 07/07/17   Breeback, Jade L, PA-C  cetirizine (ZYRTEC) 5 MG tablet Take 5 mg by mouth daily.    [provider]  Multiple Vitamin (MULTIVITAMIN) tablet Take 1 tablet by mouth daily.    [provider]  Omega-3 Fatty Acids (FISH OIL PO) Take by mouth.    [provider]  vitamin B-12 (CYANOCOBALAMIN) 500 MCG tablet Take 500 mcg by mouth daily.    [provider]  vitamin C (ASCORBIC ACID) 500 MG tablet Take 500 mg by mouth daily.    [provider]    Family History History reviewed. No pertinent family history.  Social History Social History   Tobacco Use  . Smoking status: Never Smoker  . Smokeless tobacco: Never Used  Substance Use Topics  . Alcohol use: Yes  . Drug use: No     Allergies   Patient  has no known allergies.   Review of Systems Review of Systems  Constitutional: Positive for chills, diaphoresis and fever.  HENT: Negative.   Eyes: Negative.   Respiratory: Negative.   Cardiovascular: Negative.   Gastrointestinal: Positive for abdominal distention, abdominal pain, nausea and vomiting.  Genitourinary: Negative.      Physical Exam Triage Vital Signs ED Triage Vitals  Enc Vitals Group     BP 08/18/17 0844 (!) 138/98     Pulse Rate 08/18/17 0844 (!) 123     Resp --      Temp 08/18/17 0844 100 F (37.8 C)     Temp Source 08/18/17 0844 Oral     SpO2 08/18/17 0844 95 %     Weight 08/18/17 0847 230 lb (104.3 kg)     Height 08/18/17 0847 5\' 11"  (1.803 m)     Head Circumference --      Peak Flow --      Pain Score 08/18/17 0846 7     Pain Loc --      Pain Edu? --      Excl. in [place]? --    No data found.  Updated Vital Signs BP (!) 138/98 (BP Location: Right Arm)   Pulse (!) 123   Temp 100 F (37.8 C) (Oral)   Ht 5\' 11"  (1.803 m)   Wt 230 lb (104.3 kg)  SpO2 95%   BMI 32.08 kg/m   Visual Acuity Right Eye Distance:   Left Eye Distance:   Bilateral Distance:    Right Eye Near:   Left Eye Near:    Bilateral Near:     Physical Exam  Constitutional: He appears ill.  Cardiovascular: Normal rate and regular rhythm.  Pulmonary/Chest: Effort normal and breath sounds normal.  Abdominal:  The abdomen is mildly distended. I did not hear any bowel sounds. There is significant tenderness right lower quadrant there is definite rebound. There is a positive psoas and obturator sign.  Skin: Skin is warm and dry. Capillary refill takes less than 2 seconds.     UC Treatments / Results  Labs (all labs ordered are listed, but only abnormal results are displayed) Labs Reviewed - No data to display  EKG  EKG Interpretation None       Radiology No results found.  Procedures Procedures (including critical care time)  Medications Ordered in  UC Medications - No data to display   Initial Impression / Assessment and Plan / UC Course  I have reviewed the triage vital signs and the nursing notes.  Pertinent labs & imaging results that were available during my care of the patient were reviewed by me and considered in my medical decision making (see chart for details). The patient has signs and symptoms consistent with an acute abdomen. He has peritoneal signs on examination. I'm concerned about a ruptured appendix or a ruptured diverticula. Camera operator at [employer]  was called. I will fax these notes. Patient was advised  nothing by mouth.      Final Clinical Impressions(s) / UC Diagnoses   Final diagnoses:  Acute abdomen    ED Discharge Orders    None       Controlled Substance Prescriptions [place] Controlled Substance Registry consulted? Not Applicable   Darlyne Russian, MD 08/18/17 0973    Darlyne Russian, MD 08/18/17 [phone]

## 2017-08-24 MED ORDER — KCL IN DEXTROSE-NACL 20-5-0.45 MEQ/L-%-% IV SOLN
INTRAVENOUS | Status: DC
Start: ? — End: 2017-08-24

## 2017-08-24 MED ORDER — PHENOL 1.4 % MT LIQD
OROMUCOSAL | Status: DC
Start: ? — End: 2017-08-24

## 2017-08-24 MED ORDER — HYDROCODONE-ACETAMINOPHEN 5-325 MG PO TABS
ORAL_TABLET | ORAL | Status: DC
Start: ? — End: 2017-08-24

## 2017-08-24 MED ORDER — GENERIC EXTERNAL MEDICATION
Status: DC
Start: ? — End: 2017-08-24

## 2017-08-24 MED ORDER — SODIUM CHLORIDE 0.9 % IV SOLN
INTRAVENOUS | Status: DC
Start: ? — End: 2017-08-24

## 2017-08-24 MED ORDER — PRAVASTATIN SODIUM 40 MG PO TABS
40.00 | ORAL_TABLET | ORAL | Status: DC
Start: 2017-08-22 — End: 2017-08-24

## 2017-08-24 MED ORDER — GENERIC EXTERNAL MEDICATION
3.38 | Status: DC
Start: 2017-08-22 — End: 2017-08-24

## 2017-08-24 MED ORDER — ENOXAPARIN SODIUM 40 MG/0.4ML ~~LOC~~ SOLN
40.00 | SUBCUTANEOUS | Status: DC
Start: 2017-08-23 — End: 2017-08-24

## 2017-08-24 MED ORDER — PROMETHAZINE HCL 25 MG/ML IJ SOLN
12.50 | INTRAMUSCULAR | Status: DC
Start: ? — End: 2017-08-24

## 2018-03-12 DIAGNOSIS — Z23 Encounter for immunization: Secondary | ICD-10-CM | POA: Diagnosis not present

## 2018-03-17 ENCOUNTER — Encounter: Payer: Self-pay | Admitting: Physician Assistant

## 2018-03-17 ENCOUNTER — Ambulatory Visit: Payer: BLUE CROSS/BLUE SHIELD | Admitting: Physician Assistant

## 2018-03-17 VITALS — BP 135/87 | HR 88 | Ht 71.0 in | Wt 231.0 lb

## 2018-03-17 DIAGNOSIS — Z125 Encounter for screening for malignant neoplasm of prostate: Secondary | ICD-10-CM | POA: Diagnosis not present

## 2018-03-17 DIAGNOSIS — Z6832 Body mass index (BMI) 32.0-32.9, adult: Secondary | ICD-10-CM

## 2018-03-17 DIAGNOSIS — Z0000 Encounter for general adult medical examination without abnormal findings: Secondary | ICD-10-CM | POA: Diagnosis not present

## 2018-03-17 DIAGNOSIS — Z131 Encounter for screening for diabetes mellitus: Secondary | ICD-10-CM | POA: Diagnosis not present

## 2018-03-17 DIAGNOSIS — E6609 Other obesity due to excess calories: Secondary | ICD-10-CM

## 2018-03-17 DIAGNOSIS — E785 Hyperlipidemia, unspecified: Secondary | ICD-10-CM | POA: Diagnosis not present

## 2018-03-17 MED ORDER — ATORVASTATIN CALCIUM 40 MG PO TABS: 40.0000 mg | ORAL_TABLET | Freq: Every day | ORAL | 4 refills | Status: DC

## 2018-03-17 MED ORDER — LIRAGLUTIDE -WEIGHT MANAGEMENT 18 MG/3ML ~~LOC~~ SOPN: 0.6000 mg | PEN_INJECTOR | Freq: Every day | SUBCUTANEOUS | 1 refills | Status: DC

## 2018-03-17 NOTE — Patient Instructions (Signed)
saxenda belviq topamax

## 2018-03-18 LAB — HEMOGLOBIN A1C
Hgb A1c MFr Bld: 5.5 % of total Hgb (ref <5.7)
Mean Plasma Glucose: 111 (calc)
eAG (mmol/L): 6.2 (calc)

## 2018-03-18 LAB — COMPLETE METABOLIC PANEL WITH GFR
AG Ratio: 2.1 (calc) (ref 1.0–2.5)
ALT: 25 U/L (ref 9–46)
AST: 20 U/L (ref 10–40)
Albumin: 4.9 g/dL (ref 3.6–5.1)
Alkaline phosphatase (APISO): 92 U/L (ref 40–115)
BUN: 11 mg/dL (ref 7–25)
CO2: 25 mmol/L (ref 20–32)
Calcium: 9.9 mg/dL (ref 8.6–10.3)
Chloride: 103 mmol/L (ref 98–110)
Creat: 0.84 mg/dL (ref 0.60–1.35)
GFR, Est African American: 123 mL/min/{1.73_m2} (ref >=60)
GFR, Est Non African American: 106 mL/min/{1.73_m2} (ref >=60)
Globulin: 2.3 g/dL (calc) (ref 1.9–3.7)
Glucose, Bld: 85 mg/dL (ref 65–139)
Potassium: 4.1 mmol/L (ref 3.5–5.3)
Sodium: 138 mmol/L (ref 135–146)
Total Bilirubin: 0.7 mg/dL (ref 0.2–1.2)
Total Protein: 7.2 g/dL (ref 6.1–8.1)

## 2018-03-18 LAB — LIPID PANEL W/REFLEX DIRECT LDL
Cholesterol: 149 mg/dL (ref <200)
HDL: 49 mg/dL (ref >40)
LDL Cholesterol (Calc): 79 mg/dL (calc)
Non-HDL Cholesterol (Calc): 100 mg/dL (calc) (ref <130)
Total CHOL/HDL Ratio: 3 (calc) (ref <5.0)
Triglycerides: 118 mg/dL (ref <150)

## 2018-03-18 LAB — CBC
HCT: 39.1 % (ref 38.5–50.0)
Hemoglobin: 13.6 g/dL (ref 13.2–17.1)
MCH: 30 pg (ref 27.0–33.0)
MCHC: 34.8 g/dL (ref 32.0–36.0)
MCV: 86.1 fL (ref 80.0–100.0)
MPV: 9.8 fL (ref 7.5–12.5)
Platelets: 358 10*3/uL (ref 140–400)
RBC: 4.54 10*6/uL (ref 4.20–5.80)
RDW: 13.2 % (ref 11.0–15.0)
WBC: 6.9 10*3/uL (ref 3.8–10.8)

## 2018-03-18 LAB — PSA: PSA: 1 ng/mL (ref <=4.0)

## 2018-03-18 NOTE — Progress Notes (Signed)
Call pt: labs look fantastic.  A!C overall sugar average did increase a bit so watch sugars and carbs.

## 2018-03-19 ENCOUNTER — Encounter: Payer: Self-pay | Admitting: Physician Assistant

## 2018-03-19 NOTE — Progress Notes (Signed)
Subjective:    Patient ID: Jonathan Shaw, male    DOB: 1971/01/06, 47 y.o.   MRN: [id number]  HPI Pt is a 47 yo male with dyslipidemia who presents to the clinic for follow up and medication refill.   Pt would like to discuss weight loss. He would like to make that a priority.   .. Active Ambulatory Problems    Diagnosis Date Noted  . Seasonal allergies 10/11/2014  . Class 1 obesity due to excess calories without serious comorbidity with body mass index (BMI) of 32.0 to 32.9 in adult 10/11/2014  . Abnormal weight gain 10/11/2014  . Hyperlipidemia 10/14/2014  . Dyslipidemia (high LDL; low HDL) 10/13/2015  . Constipation 11/21/2015  . Heel pain 11/21/2015  . Elevated fasting glucose 02/21/2016   Resolved Ambulatory Problems    Diagnosis Date Noted  . Acute pharyngitis 05/27/2009   Past Medical History:  Diagnosis Date  . High cholesterol    .Marland KitchenHistory reviewed. No pertinent family history. .. Social History   Socioeconomic History  . Marital status: Married    Spouse name: Not on file  . Number of children: Not on file  . Years of education: Not on file  . Highest education level: Not on file  Occupational History  . Not on file  Social Needs  . Financial resource strain: Not on file  . Food insecurity:    Worry: Not on file    Inability: Not on file  . Transportation needs:    Medical: Not on file    Non-medical: Not on file  Tobacco Use  . Smoking status: Never Smoker  . Smokeless tobacco: Never Used  Substance and Sexual Activity  . Alcohol use: Yes  . Drug use: No  . Sexual activity: Not on file  Lifestyle  . Physical activity:    Days per week: Not on file    Minutes per session: Not on file  . Stress: Not on file  Relationships  . Social connections:    Talks on phone: Not on file    Gets together: Not on file    Attends religious service: Not on file    Active member of club or organization: Not on file    Attends meetings of clubs or  organizations: Not on file    Relationship status: Not on file  . Intimate partner violence:    Fear of current or ex partner: Not on file    Emotionally abused: Not on file    Physically abused: Not on file    Forced sexual activity: Not on file  Other Topics Concern  . Not on file  Social History Narrative  . Not on file      Review of Systems  All other systems reviewed and are negative.      Objective:   Physical Exam  Constitutional: He is oriented to person, place, and time. He appears well-developed and well-nourished.  HENT:  Head: Normocephalic and atraumatic.  Right Ear: External ear normal.  Left Ear: External ear normal.  Nose: Nose normal.  Mouth/Throat: Oropharynx is clear and moist. No oropharyngeal exudate.  Eyes: Pupils are equal, round, and reactive to light. Conjunctivae and EOM are normal.  Neck: Normal range of motion. Neck supple. No thyromegaly present.  Cardiovascular: Normal rate and regular rhythm.  Pulmonary/Chest: Effort normal and breath sounds normal.  Abdominal: Soft. Bowel sounds are normal. He exhibits no distension and no mass. There is no tenderness. There is no rebound and  no guarding.  Lymphadenopathy:    He has no cervical adenopathy.  Neurological: He is alert and oriented to person, place, and time.  Skin: No rash noted.  Psychiatric: He has a normal mood and affect. His behavior is normal.          Assessment & Plan:  Marland KitchenMarland KitchenDiagnoses and all orders for this visit:  Preventative health care -     Lipid Panel w/reflex Direct LDL -     COMPLETE METABOLIC PANEL WITH GFR -     PSA -     CBC  Dyslipidemia (high LDL; low HDL) -     Lipid Panel w/reflex Direct LDL -     atorvastatin (LIPITOR) 40 MG tablet; Take 1 tablet (40 mg total) by mouth daily.  Screening for diabetes mellitus -     COMPLETE METABOLIC PANEL WITH GFR -     Hemoglobin A1c  Prostate cancer screening -     PSA  Class 1 obesity due to excess calories  without serious comorbidity with body mass index (BMI) of 32.0 to 32.9 in adult -     Liraglutide -Weight Management (SAXENDA) 18 MG/3ML SOPN; Inject 0.6 mg into the skin daily. For one week then increase by .6mg  weekly until reaches 3mg  daily.  Please include ultra fine needles 3mm   .Marland Kitchen Depression screen [facility] 2/9 03/19/2018 10/14/2016  Decreased Interest 0 0  Down, Depressed, Hopeless 0 0  PHQ - 2 Score 0 0   .Marland KitchenStart a regular exercise program and make sure you are eating a healthy diet Try to eat 4 servings of dairy a day or take a calcium supplement (500mg  twice a day). Your vaccines are up to date.  Fasting labs ordered. Refilled lipitor.   Marland Kitchen.Discussed low carb diet with 1500 calories and 80g of protein.  Exercising at least 150 minutes a week.  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  saxenda ordered. Discussed side effects. Given coupon card. Follow up in 3 months. Pt aware to titrate up slowly.

## 2018-03-23 ENCOUNTER — Telehealth: Payer: Self-pay | Admitting: Physician Assistant

## 2018-03-23 NOTE — Telephone Encounter (Signed)
Received fax from [employer] that Saxenda requires a PA. Information has been sent to the insurance company. Awaiting determination.   

## 2018-03-26 NOTE — Telephone Encounter (Signed)
Received fax from [facility] that Saxenda was approved from 03/23/2018 through 07/26/2018. Form sent to scan and pharmacy aware. -hsm.

## 2018-03-30 ENCOUNTER — Telehealth: Payer: Self-pay

## 2018-03-30 ENCOUNTER — Telehealth: Payer: Self-pay | Admitting: Physician Assistant

## 2018-03-30 NOTE — Telephone Encounter (Signed)
Jonathan Shaw is in need of a prescription for needles so he can inject his Saxenda. Patient confirmed he received the medication, but now he needs the needles for injections. Thanks so much!

## 2018-03-30 NOTE — Telephone Encounter (Signed)
PT's needs prescription for needle part of Saxenda. (Already received medication)  Please advise.

## 2018-03-30 NOTE — Telephone Encounter (Signed)
Ok to send needles. Novofine 36g 11mm needles once a day. #100

## 2018-03-31 ENCOUNTER — Other Ambulatory Visit: Payer: Self-pay

## 2018-03-31 MED ORDER — INSULIN PEN NEEDLE 32G X 6 MM MISC: 5 refills | Status: DC

## 2018-03-31 NOTE — Telephone Encounter (Signed)
Done

## 2018-04-01 NOTE — Telephone Encounter (Signed)
Per chart, RX has been sent

## 2018-05-17 ENCOUNTER — Other Ambulatory Visit: Payer: Self-pay | Admitting: Physician Assistant

## 2018-05-17 DIAGNOSIS — E6609 Other obesity due to excess calories: Secondary | ICD-10-CM

## 2018-05-17 DIAGNOSIS — Z6832 Body mass index (BMI) 32.0-32.9, adult: Principal | ICD-10-CM

## 2018-06-19 NOTE — Telephone Encounter (Signed)
No action is needed.  

## 2018-07-13 ENCOUNTER — Emergency Department
Admission: EM | Admit: 2018-07-13 | Discharge: 2018-07-13 | Disposition: A | Discharge status: Home / Self Care | Payer: BLUE CROSS/BLUE SHIELD | Source: Home / Self Care | Attending: Emergency Medicine | Admitting: Emergency Medicine

## 2018-07-13 ENCOUNTER — Encounter: Payer: Self-pay | Admitting: *Deleted

## 2018-07-13 DIAGNOSIS — R69 Illness, unspecified: Secondary | ICD-10-CM | POA: Diagnosis not present

## 2018-07-13 DIAGNOSIS — K122 Cellulitis and abscess of mouth: Secondary | ICD-10-CM

## 2018-07-13 DIAGNOSIS — J111 Influenza due to unidentified influenza virus with other respiratory manifestations: Secondary | ICD-10-CM

## 2018-07-13 LAB — POCT INFLUENZA A/B
Influenza A, POC: NEGATIVE
Influenza B, POC: NEGATIVE

## 2018-07-13 LAB — POCT RAPID STREP A (OFFICE): Rapid Strep A Screen: NEGATIVE

## 2018-07-13 NOTE — Discharge Instructions (Addendum)
Please return to clinic if worsening sore throat or persistent symptoms. We will call you with culture results. Continue Tylenol or ibuprofen for fever. Please be sure and drink fluids.

## 2018-07-13 NOTE — ED Provider Notes (Signed)
[name]    CSN: [id number] Arrival date & time: 07/13/18  1159     History   Chief Complaint Chief Complaint  Patient presents with  . Sore Throat  . Fever  . Generalized Body Aches  . Chills    HPI Jonathan Shaw is a 48 y.o. male.   HPI Patient presents with onset of symptoms on Saturday.  He started with fever, myalgias, and a dry cough.  His throat is very sore and he is noted swelling of his uvula.  He has had no ear pain.  He has been tolerating fluids.  He has had a flu shot.  He is a non-smoker. Past Medical History:  Diagnosis Date  . High cholesterol     Patient Active Problem List   Diagnosis Date Noted  . Elevated fasting glucose 02/21/2016  . Constipation 11/21/2015  . Heel pain 11/21/2015  . Dyslipidemia (high LDL; low HDL) 10/13/2015  . Hyperlipidemia 10/14/2014  . Seasonal allergies 10/11/2014  . Class 1 obesity due to excess calories without serious comorbidity with body mass index (BMI) of 32.0 to 32.9 in adult 10/11/2014  . Abnormal weight gain 10/11/2014    History reviewed. No pertinent surgical history.     Home Medications    Prior to Admission medications   Medication Sig Start Date End Date Taking? Authorizing Provider  atorvastatin (LIPITOR) 40 MG tablet Take 1 tablet (40 mg total) by mouth daily. 03/17/18  Yes Breeback, Jade L, PA-C  cetirizine (ZYRTEC) 5 MG tablet Take 5 mg by mouth daily.    [provider]  Insulin Pen Needle (NOVOFINE) 32G X 6 MM MISC Inject into skin subcutaneously once daily. Use with Saxenda pen. 03/31/18   Hali Marry, MD  Multiple Vitamin (MULTIVITAMIN) tablet Take 1 tablet by mouth daily.    [provider]  Omega-3 Fatty Acids (FISH OIL PO) Take by mouth.    [provider]  SAXENDA 18 MG/3ML SOPN INJECT 0.6MG  DAILY FOR 1 WEEK THEN INCREASE BY 0.6MG  UNTIL REACHING 3MG  DAILY 05/18/18   Breeback, Jade L, PA-C  vitamin B-12 (CYANOCOBALAMIN) 500 MCG tablet Take  500 mcg by mouth daily.    [provider]  vitamin C (ASCORBIC ACID) 500 MG tablet Take 500 mg by mouth daily.    [provider]    Family History Family History  Problem Relation Age of Onset  . Other Mother   . Other Father     Social History Social History   Tobacco Use  . Smoking status: Never Smoker  . Smokeless tobacco: Never Used  Substance Use Topics  . Alcohol use: Yes  . Drug use: No     Allergies   Patient has no known allergies.   Review of Systems Review of Systems  Constitutional: Positive for fever.  HENT: Positive for congestion and sore throat.   Eyes: Negative.   Respiratory: Positive for cough.   Cardiovascular: Negative.   Gastrointestinal: Negative.   Musculoskeletal: Positive for myalgias.     Physical Exam Triage Vital Signs ED Triage Vitals  Enc Vitals Group     BP 07/13/18 1249 134/86     Pulse Rate 07/13/18 1249 (!) 106     Resp 07/13/18 1249 16     Temp 07/13/18 1249 98.3 F (36.8 C)     Temp Source 07/13/18 1249 Oral     SpO2 07/13/18 1249 96 %     Weight 07/13/18 1250 217 lb (98.4 kg)  Height 07/13/18 1250 5\' 11"  (1.803 m)     Head Circumference --      Peak Flow --      Pain Score 07/13/18 1249 0     Pain Loc --      Pain Edu? --      Excl. in [place]? --    No data found.  Updated Vital Signs BP 134/86 (BP Location: Right Arm)   Pulse (!) 106   Temp 98.3 F (36.8 C) (Oral)   Resp 16   Ht 5\' 11"  (1.803 m)   Wt 98.4 kg   SpO2 96%   BMI 30.27 kg/m   Visual Acuity Right Eye Distance:   Left Eye Distance:   Bilateral Distance:    Right Eye Near:   Left Eye Near:    Bilateral Near:     Physical Exam Constitutional:      Appearance: He is well-developed.  HENT:     Head: Normocephalic.     Right Ear: Tympanic membrane normal.     Left Ear: Tympanic membrane normal.     Nose: Congestion present.     Mouth/Throat:     Comments: There is some swelling involving the uvula.  The posterior  pharynx is mildly red but no exudate.  Tongue is normal.  Mucous membranes are moist. Eyes:     Pupils: Pupils are equal, round, and reactive to light.  Cardiovascular:     Rate and Rhythm: Normal rate and regular rhythm.     Heart sounds: Normal heart sounds.  Pulmonary:     Effort: Pulmonary effort is normal.     Breath sounds: Normal breath sounds.  Neurological:     Mental Status: He is alert.      UC Treatments / Results  Labs (all labs ordered are listed, but only abnormal results are displayed) Labs Reviewed  STREP A DNA PROBE  POCT INFLUENZA A/B  POCT RAPID STREP A (OFFICE)    EKG None  Radiology No results found.  Procedures Procedures (including critical care time)  Medications Ordered in UC Medications - No data to display  Initial Impression / Assessment and Plan / UC Course  I have reviewed the triage vital signs and the nursing notes.  Pertinent labs & imaging results that were available during my care of the patient were reviewed by me and considered in my medical decision making (see chart for details). Strep test negative flu test negative.  Patient does not want to take Tamiflu.  Strep culture was sent.  He will treat this symptomatically with salt water gargles and force fluids.  I gave him information about uvulitis and also about influenza.  That way he would return to clinic or present to the ER if he had worsening symptoms.      Final Clinical Impressions(s) / UC Diagnoses   Final diagnoses:  Influenza-like illness  Uvulitis     Discharge Instructions     Please return to clinic if worsening sore throat or persistent symptoms. We will call you with culture results. Continue Tylenol or ibuprofen for fever. Please be sure and drink fluids.    ED Prescriptions    None     Controlled Substance Prescriptions [place] Controlled Substance Registry consulted? Not Applicable   Darlyne Russian, MD 07/13/18 1335

## 2018-07-13 NOTE — ED Triage Notes (Signed)
Sore throat, fever, body aches, chills, since Saturday. Worse today.

## 2018-07-14 ENCOUNTER — Telehealth: Payer: Self-pay

## 2018-07-14 LAB — STREP A DNA PROBE: Group A Strep Probe: NOT DETECTED

## 2018-07-14 NOTE — Telephone Encounter (Signed)
Left voice message inquiring about patients status. Left neg tcx results. Encouraged patient to call with questions or concerns.

## 2018-07-15 ENCOUNTER — Other Ambulatory Visit: Payer: Self-pay | Admitting: Physician Assistant

## 2018-07-15 DIAGNOSIS — E6609 Other obesity due to excess calories: Secondary | ICD-10-CM

## 2018-07-15 DIAGNOSIS — Z6832 Body mass index (BMI) 32.0-32.9, adult: Principal | ICD-10-CM

## 2018-07-15 NOTE — Telephone Encounter (Signed)
Needs office visit.

## 2018-07-28 ENCOUNTER — Encounter: Payer: Self-pay | Admitting: Physician Assistant

## 2018-07-28 ENCOUNTER — Ambulatory Visit: Payer: BLUE CROSS/BLUE SHIELD | Admitting: Physician Assistant

## 2018-07-28 VITALS — BP 130/83 | HR 99 | Ht 71.0 in | Wt 216.0 lb

## 2018-07-28 DIAGNOSIS — K5790 Diverticulosis of intestine, part unspecified, without perforation or abscess without bleeding: Secondary | ICD-10-CM | POA: Diagnosis not present

## 2018-07-28 DIAGNOSIS — K4090 Unilateral inguinal hernia, without obstruction or gangrene, not specified as recurrent: Secondary | ICD-10-CM

## 2018-07-28 DIAGNOSIS — E6609 Other obesity due to excess calories: Secondary | ICD-10-CM

## 2018-07-28 DIAGNOSIS — Z6832 Body mass index (BMI) 32.0-32.9, adult: Secondary | ICD-10-CM

## 2018-07-28 DIAGNOSIS — J0100 Acute maxillary sinusitis, unspecified: Secondary | ICD-10-CM

## 2018-07-28 DIAGNOSIS — K5904 Chronic idiopathic constipation: Secondary | ICD-10-CM

## 2018-07-28 MED ORDER — HYDROCOD POLST-CPM POLST ER 10-8 MG/5ML PO SUER: 5.0000 mL | Freq: Two times a day (BID) | ORAL | 0 refills | Status: DC | PRN

## 2018-07-28 MED ORDER — AZITHROMYCIN 250 MG PO TABS: ORAL_TABLET | ORAL | 0 refills | Status: DC

## 2018-07-28 MED ORDER — LIRAGLUTIDE -WEIGHT MANAGEMENT 18 MG/3ML ~~LOC~~ SOPN: 3.0000 mg | PEN_INJECTOR | Freq: Every day | SUBCUTANEOUS | 1 refills | Status: DC

## 2018-07-28 MED ORDER — FLUTICASONE PROPIONATE 50 MCG/ACT NA SUSP: 2.0000 | Freq: Every day | NASAL | 6 refills | Status: DC

## 2018-07-28 NOTE — Telephone Encounter (Signed)
Advised the scheduler Janett Billow and she will call patient to set up appointment for med follow up. KG LPN

## 2018-07-28 NOTE — Progress Notes (Signed)
Subjective:    Patient ID: Jonathan Shaw, male    DOB: 11-16-1970, 48 y.o.   MRN: [id number]  HPI  Patient presented today for follow up on Saxenda. Patient has lost 15 pounds in the last 3 months and is tolerating the drug well. He thinks the drug is "great". He is not experiencing any side effects.   Patient being worked up for possibly Mudlogger. A CT was performed and patient had questions regarding some of the results. Patient was also evaluated for hypertension. His most recent systolic pressures have been running between 130-140. Patient had some concern and questions about medications.  Patient went to urgent care 1/27 complaining of sore throat, fever, generalized body aches and chills. His flu swap came back negative and he was treated symptomatically. He has not felt any better in the last two weeks and feels a lot of sinus pressure and has a constant cough that keeps him up at night.. He currently denies any fever. Everyone at his work has been recently sick as well.     Review of Systems  Constitutional: Positive for fatigue. Negative for chills and fever.  HENT: Positive for congestion, postnasal drip, sinus pressure, sinus pain and sore throat.   Eyes: Negative.   Respiratory: Positive for cough. Negative for chest tightness.   Cardiovascular: Negative for chest pain.  Gastrointestinal: Negative for diarrhea and nausea.  Genitourinary: Negative.        Objective:   Physical Exam Constitutional:      Appearance: Normal appearance.  HENT:     Head: Normocephalic and atraumatic.     Right Ear: Tympanic membrane and ear canal normal.     Left Ear: Tympanic membrane and ear canal normal.     Nose: Congestion present.     Mouth/Throat:     Pharynx: Oropharynx is clear. No posterior oropharyngeal erythema.  Neck:     Musculoskeletal: Normal range of motion.  Cardiovascular:     Rate and Rhythm: Normal rate and regular rhythm.  Pulmonary:     Effort: Pulmonary  effort is normal.     Breath sounds: Normal breath sounds.  Neurological:     General: No focal deficit present.     Mental Status: He is alert and oriented to person, place, and time.           Assessment & Plan:  .Marland KitchenNox was seen today for follow-up.  Diagnoses and all orders for this visit:  Acute non-recurrent maxillary sinusitis -     azithromycin (ZITHROMAX Z-PAK) 250 MG tablet; Take 2 tablets (500 mg) on  Day 1,  followed by 1 tablet (250 mg) once daily on Days 2 through 5. -     fluticasone (FLONASE) 50 MCG/ACT nasal spray; Place 2 sprays into both nostrils daily. -     chlorpheniramine-HYDROcodone ([place]) 10-8 MG/5ML SUER; Take 5 mLs by mouth every 12 (twelve) hours as needed.  Class 1 obesity due to excess calories without serious comorbidity with body mass index (BMI) of 32.0 to 32.9 in adult -     Liraglutide -Weight Management (SAXENDA) 18 MG/3ML SOPN; Inject 3 mg into the skin daily.  Diverticulosis  Non-recurrent unilateral inguinal hernia without obstruction or gangrene  Chronic idiopathic constipation   Likely secondary sickening from viral infection last month.Treated for sinus infection with zpak and flonase. Cough keeping him up at night. tussionex given with no concerns. Sedation warning discussed. HO given.   Weight loss is great. Continue on saxenda. Keep  eating healthy and staying active.   Discussed CT findings and gave reassurance. Watch seeds and nuts. Keep full soft bowel movements. Consider probiotic. Constipation HO given.    Marland KitchenVernetta Honey PA-C, have reviewed and agree with the above documentation in it's entirety.

## 2018-07-28 NOTE — Patient Instructions (Addendum)
\Sinusitis, Adult Sinusitis is soreness and swelling (inflammation) of your sinuses. Sinuses are hollow spaces in the bones around your face. They are located:  Around your eyes.  In the middle of your forehead.  Behind your nose.  In your cheekbones. Your sinuses and nasal passages are lined with a fluid called mucus. Mucus drains out of your sinuses. Swelling can trap mucus in your sinuses. This lets germs (bacteria, virus, or fungus) grow, which leads to infection. Most of the time, this condition is caused by a virus. What are the causes? This condition is caused by:  Allergies.  Asthma.  Germs.  Things that block your nose or sinuses.  Growths in the nose (nasal polyps).  Chemicals or irritants in the air.  Fungus (rare). What increases the risk? You are more likely to develop this condition if:  You have a weak body defense system (immune system).  You do a lot of swimming or diving.  You use nasal sprays too much.  You smoke. What are the signs or symptoms? The main symptoms of this condition are pain and a feeling of pressure around the sinuses. Other symptoms include:  Stuffy nose (congestion).  Runny nose (drainage).  Swelling and warmth in the sinuses.  Headache.  Toothache.  A cough that may get worse at night.  Mucus that collects in the throat or the back of the nose (postnasal drip).  Being unable to smell and taste.  Being very tired (fatigue).  A fever.  Sore throat.  Bad breath. How is this diagnosed? This condition is diagnosed based on:  Your symptoms.  Your medical history.  A physical exam.  Tests to find out if your condition is short-term (acute) or long-term (chronic). Your doctor may: ? Check your nose for growths (polyps). ? Check your sinuses using a tool that has a light (endoscope). ? Check for allergies or germs. ? Do imaging tests, such as an MRI or CT scan. How is this treated? Treatment for this condition  depends on the cause and whether it is short-term or long-term.  If caused by a virus, your symptoms should go away on their own within 10 days. You may be given medicines to relieve symptoms. They include: ? Medicines that shrink swollen tissue in the nose. ? Medicines that treat allergies (antihistamines). ? A spray that treats swelling of the nostrils. ? Rinses that help get rid of thick mucus in your nose (nasal saline washes).  If caused by bacteria, your doctor may wait to see if you will get better without treatment. You may be given antibiotic medicine if you have: ? A very bad infection. ? A weak body defense system.  If caused by growths in the nose, you may need to have surgery. Follow these instructions at home: Medicines  Take, use, or apply over-the-counter and prescription medicines only as told by your doctor. These may include nasal sprays.  If you were prescribed an antibiotic medicine, take it as told by your doctor. Do not stop taking the antibiotic even if you start to feel better. Hydrate and humidify   Drink enough water to keep your pee (urine) pale yellow.  Use a cool mist humidifier to keep the humidity level in your home above 50%.  Breathe in steam for 10-15 minutes, 3-4 times a day, or as told by your doctor. You can do this in the bathroom while a hot shower is running.  Try not to spend time in cool or dry air.  Rest  Rest as much as you can.  Sleep with your head raised (elevated).  Make sure you get enough sleep each night. General instructions   Put a warm, moist washcloth on your face 3-4 times a day, or as often as told by your doctor. This will help with discomfort.  Wash your hands often with soap and water. If there is no soap and water, use hand sanitizer.  Do not smoke. Avoid being around people who are smoking (secondhand smoke).  Keep all follow-up visits as told by your doctor. This is important. Contact a doctor if:  You  have a fever.  Your symptoms get worse.  Your symptoms do not get better within 10 days. Get help right away if:  You have a very bad headache.  You cannot stop throwing up (vomiting).  You have very bad pain or swelling around your face or eyes.  You have trouble seeing.  You feel confused.  Your neck is stiff.  You have trouble breathing. Summary  Sinusitis is swelling of your sinuses. Sinuses are hollow spaces in the bones around your face.  This condition is caused by tissues in your nose that become inflamed or swollen. This traps germs. These can lead to infection.  If you were prescribed an antibiotic medicine, take it as told by your doctor. Do not stop taking it even if you start to feel better.  Keep all follow-up visits as told by your doctor. This is important. This information is not intended to replace advice given to you by your health care provider. Make sure you discuss any questions you have with your health care provider. Document Released: 11/20/2007 Document Revised: 11/03/2017 Document Reviewed: 11/03/2017 Elsevier Interactive Patient Education  2019 [employer].    Constipation, Adult Constipation is when a person:  Poops (has a bowel movement) fewer times in a week than normal.  Has a hard time pooping.  Has poop that is dry, hard, or bigger than normal. Follow these instructions at home: Eating and drinking   Eat foods that have a lot of fiber, such as: ? Fresh fruits and vegetables. ? Whole grains. ? Beans.  Eat less of foods that are high in fat, low in fiber, or overly processed, such as: ? [place] fries. ? Hamburgers. ? Cookies. ? Candy. ? Soda.  Drink enough fluid to keep your pee (urine) clear or pale yellow. General instructions  Exercise regularly or as told by your doctor.  Go to the restroom when you feel like you need to poop. Do not hold it in.  Take over-the-counter and prescription medicines only as told by your  doctor. These include any fiber supplements.  Do pelvic floor retraining exercises, such as: ? Doing deep breathing while relaxing your lower belly (abdomen). ? Relaxing your pelvic floor while pooping.  Watch your condition for any changes.  Keep all follow-up visits as told by your doctor. This is important. Contact a doctor if:  You have pain that gets worse.  You have a fever.  You have not pooped for 4 days.  You throw up (vomit).  You are not hungry.  You lose weight.  You are bleeding from the anus.  You have thin, pencil-like poop (stool). Get help right away if:  You have a fever, and your symptoms suddenly get worse.  You leak poop or have blood in your poop.  Your belly feels hard or bigger than normal (is bloated).  You have very bad belly  pain.  You feel dizzy or you faint. This information is not intended to replace advice given to you by your health care provider. Make sure you discuss any questions you have with your health care provider. Document Released: 11/20/2007 Document Revised: 12/22/2015 Document Reviewed: 11/22/2015 Elsevier Interactive Patient Education  2019 [employer].

## 2018-10-03 ENCOUNTER — Other Ambulatory Visit: Payer: Self-pay | Admitting: Physician Assistant

## 2018-10-03 DIAGNOSIS — Z6832 Body mass index (BMI) 32.0-32.9, adult: Principal | ICD-10-CM

## 2018-10-03 DIAGNOSIS — E6609 Other obesity due to excess calories: Secondary | ICD-10-CM

## 2019-01-04 ENCOUNTER — Other Ambulatory Visit: Payer: Self-pay | Admitting: Physician Assistant

## 2019-01-04 DIAGNOSIS — Z6832 Body mass index (BMI) 32.0-32.9, adult: Secondary | ICD-10-CM

## 2019-01-04 DIAGNOSIS — E6609 Other obesity due to excess calories: Secondary | ICD-10-CM

## 2019-03-18 ENCOUNTER — Other Ambulatory Visit: Payer: Self-pay

## 2019-03-18 ENCOUNTER — Ambulatory Visit (INDEPENDENT_AMBULATORY_CARE_PROVIDER_SITE_OTHER): Payer: BC Managed Care – PPO | Admitting: Physician Assistant

## 2019-03-18 ENCOUNTER — Encounter: Payer: Self-pay | Admitting: Physician Assistant

## 2019-03-18 VITALS — BP 131/75 | HR 103 | Temp 98.4°F

## 2019-03-18 DIAGNOSIS — J029 Acute pharyngitis, unspecified: Secondary | ICD-10-CM

## 2019-03-18 DIAGNOSIS — R0683 Snoring: Secondary | ICD-10-CM

## 2019-03-18 DIAGNOSIS — R221 Localized swelling, mass and lump, neck: Secondary | ICD-10-CM

## 2019-03-18 LAB — POCT RAPID STREP A (OFFICE): Rapid Strep A Screen: NEGATIVE

## 2019-03-18 MED ORDER — METHYLPREDNISOLONE ACETATE 80 MG/ML IJ SUSP
80.0000 mg | Freq: Once | INTRAMUSCULAR | Status: AC
  Administered 2019-03-18: 80 mg via INTRAMUSCULAR

## 2019-03-18 MED ORDER — AMOXICILLIN-POT CLAVULANATE 875-125 MG PO TABS: 1.0000 | ORAL_TABLET | Freq: Two times a day (BID) | ORAL | 0 refills | Status: DC

## 2019-03-18 NOTE — Progress Notes (Signed)
Subjective:    Patient ID: Jonathan Shaw, male    DOB: Nov 05, 1970, 48 y.o.   MRN: [id number]  HPI  Pt is a 48 yo male who presents to the clinic with swollen uvula and ST for the last 2 days. He feels like symptoms are getting rapidly worse. He denies any fever, chill, headaches, sinus pressure, cough, SOB, loss of smell or taste, GI symptoms. He is taking ibuprofen and tylenol with a little relief. At times he is having problems swallowing. No problems breathing. This seems to happen every 2 years.   .. Active Ambulatory Problems    Diagnosis Date Noted  . Pharyngitis 05/27/2009  . Seasonal allergies 10/11/2014  . Class 1 obesity due to excess calories without serious comorbidity with body mass index (BMI) of 32.0 to 32.9 in adult 10/11/2014  . Abnormal weight gain 10/11/2014  . Hyperlipidemia 10/14/2014  . Dyslipidemia (high LDL; low HDL) 10/13/2015  . Constipation 11/21/2015  . Heel pain 11/21/2015  . Elevated fasting glucose 02/21/2016  . Non-recurrent unilateral inguinal hernia without obstruction or gangrene 07/28/2018  . Diverticulosis 07/28/2018  . Snoring 03/19/2019  . Swollen uvula 03/19/2019   Resolved Ambulatory Problems    Diagnosis Date Noted  . No Resolved Ambulatory Problems   Past Medical History:  Diagnosis Date  . High cholesterol        Review of Systems See HPI.     Objective:   Physical Exam Vitals signs reviewed.  Constitutional:      Appearance: Normal appearance.  HENT:     Head: Normocephalic.     Comments: No sinus pressure to palpation.     Right Ear: Tympanic membrane and external ear normal. There is no impacted cerumen.     Left Ear: Tympanic membrane and external ear normal. There is no impacted cerumen.     Nose: Nose normal.     Mouth/Throat:     Comments: Erythematous oropharynx with swollen enlarged erythematous uvula with white patches on it.  Tonsils normal size.  Eyes:     Extraocular Movements: Extraocular movements  intact.     Conjunctiva/sclera: Conjunctivae normal.     Pupils: Pupils are equal, round, and reactive to light.  Cardiovascular:     Rate and Rhythm: Normal rate.     Pulses: Normal pulses.  Pulmonary:     Effort: Pulmonary effort is normal.     Breath sounds: Normal breath sounds.  Neurological:     General: No focal deficit present.     Mental Status: He is alert and oriented to person, place, and time.  Psychiatric:        Mood and Affect: Mood normal.     .. Results for orders placed or performed in visit on 03/18/19  POCT rapid strep A  Result Value Ref Range   Rapid Strep A Screen Negative Negative         Assessment & Plan:  Marland KitchenMarland KitchenDiagnoses and all orders for this visit:  Pharyngitis, unspecified etiology -     amoxicillin-clavulanate (AUGMENTIN) 875-125 MG tablet; Take 1 tablet by mouth 2 (two) times daily. -     POCT rapid strep A -     methylPREDNISolone acetate (DEPO-MEDROL) injection 80 mg -     Ambulatory referral to ENT  Swollen uvula -     amoxicillin-clavulanate (AUGMENTIN) 875-125 MG tablet; Take 1 tablet by mouth 2 (two) times daily. -     POCT rapid strep A -     methylPREDNISolone  acetate (DEPO-MEDROL) injection 80 mg -     Ambulatory referral to ENT  Snoring   Rapid strep was negative.   With recurrent hx pharyngitis. In the past he has been treated with abx and steroids. He feels like this is happening every 2 years and wants to see about getting it removed. He snores a lot as well and wonders if that is effecting his sleep. Depo medrol and augmentin given today.

## 2019-03-18 NOTE — Patient Instructions (Signed)

## 2019-03-19 ENCOUNTER — Encounter: Payer: Self-pay | Admitting: Physician Assistant

## 2019-03-19 DIAGNOSIS — R221 Localized swelling, mass and lump, neck: Secondary | ICD-10-CM | POA: Insufficient documentation

## 2019-03-19 DIAGNOSIS — R0683 Snoring: Secondary | ICD-10-CM | POA: Insufficient documentation

## 2019-03-22 ENCOUNTER — Other Ambulatory Visit: Payer: Self-pay | Admitting: Physician Assistant

## 2019-03-22 DIAGNOSIS — E785 Hyperlipidemia, unspecified: Secondary | ICD-10-CM

## 2019-04-01 DIAGNOSIS — K1379 Other lesions of oral mucosa: Secondary | ICD-10-CM | POA: Diagnosis not present

## 2019-05-03 DIAGNOSIS — Z20828 Contact with and (suspected) exposure to other viral communicable diseases: Secondary | ICD-10-CM | POA: Diagnosis not present

## 2019-05-10 DIAGNOSIS — K1379 Other lesions of oral mucosa: Secondary | ICD-10-CM | POA: Diagnosis not present

## 2019-05-28 DIAGNOSIS — Z20828 Contact with and (suspected) exposure to other viral communicable diseases: Secondary | ICD-10-CM | POA: Diagnosis not present

## 2019-06-03 ENCOUNTER — Other Ambulatory Visit: Payer: Self-pay | Admitting: Physician Assistant

## 2019-06-03 DIAGNOSIS — Z6832 Body mass index (BMI) 32.0-32.9, adult: Secondary | ICD-10-CM

## 2019-06-03 DIAGNOSIS — E6609 Other obesity due to excess calories: Secondary | ICD-10-CM

## 2019-06-19 ENCOUNTER — Other Ambulatory Visit: Payer: Self-pay | Admitting: Physician Assistant

## 2019-06-19 DIAGNOSIS — E785 Hyperlipidemia, unspecified: Secondary | ICD-10-CM

## 2019-06-23 ENCOUNTER — Other Ambulatory Visit: Payer: Self-pay

## 2019-06-23 ENCOUNTER — Ambulatory Visit (INDEPENDENT_AMBULATORY_CARE_PROVIDER_SITE_OTHER): Payer: BC Managed Care – PPO | Admitting: Physician Assistant

## 2019-06-23 VITALS — BP 127/84 | HR 106 | Ht 71.0 in | Wt 224.0 lb

## 2019-06-23 DIAGNOSIS — E785 Hyperlipidemia, unspecified: Secondary | ICD-10-CM

## 2019-06-23 DIAGNOSIS — Z6831 Body mass index (BMI) 31.0-31.9, adult: Secondary | ICD-10-CM

## 2019-06-23 DIAGNOSIS — R7301 Impaired fasting glucose: Secondary | ICD-10-CM

## 2019-06-23 DIAGNOSIS — E6609 Other obesity due to excess calories: Secondary | ICD-10-CM

## 2019-06-23 DIAGNOSIS — Z131 Encounter for screening for diabetes mellitus: Secondary | ICD-10-CM

## 2019-06-23 MED ORDER — SAXENDA 18 MG/3ML ~~LOC~~ SOPN: 3.0000 mg | PEN_INJECTOR | Freq: Every day | SUBCUTANEOUS | 5 refills | Status: DC

## 2019-06-23 MED ORDER — ATORVASTATIN CALCIUM 40 MG PO TABS: ORAL_TABLET | ORAL | 3 refills | Status: DC

## 2019-06-23 NOTE — Progress Notes (Signed)
   Subjective:    Patient ID: Jonathan Shaw, male    DOB: 1971/03/17, 49 y.o.   MRN: [id number]  HPI  Pt is a 49 yo obese male with dyslipidemia who presents to the clinic for refill on saxenda.   Pt is doing great. 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 He loves saxenda and how it curbs his appetitie. He is down from 237 but not to goal. No side effects.    Active Ambulatory Problems    Diagnosis Date Noted  . Seasonal allergies 10/11/2014  . Class 1 obesity due to excess calories with serious comorbidity and body mass index (BMI) of 31.0 to 31.9 in adult 10/11/2014  . Abnormal weight gain 10/11/2014  . Hyperlipidemia 10/14/2014  . Dyslipidemia (high LDL; low HDL) 10/13/2015  . Constipation 11/21/2015  . Heel pain 11/21/2015  . Elevated fasting glucose 02/21/2016  . Non-recurrent unilateral inguinal hernia without obstruction or gangrene 07/28/2018  . Diverticulosis 07/28/2018  . Snoring 03/19/2019   Resolved Ambulatory Problems    Diagnosis Date Noted  . Pharyngitis 05/27/2009  . Swollen uvula 03/19/2019   Past Medical History:  Diagnosis Date  . High cholesterol        Review of Systems  Neurological: Negative for numbness.  All other systems reviewed and are negative.      Objective:   Physical Exam Vitals reviewed.  Constitutional:      Appearance: Normal appearance.  HENT:     Head: Normocephalic.  Cardiovascular:     Rate and Rhythm: Normal rate and regular rhythm.     Pulses: Normal pulses.  Pulmonary:     Effort: Pulmonary effort is normal.     Breath sounds: Normal breath sounds.  Neurological:     General: No focal deficit present.     Mental Status: He is alert and oriented to person, place, and time.  Psychiatric:        Mood and Affect: Mood normal.           Assessment & Plan:  .Marland KitchenAmadou was seen today for weight check and hyperlipidemia.  Diagnoses and all orders for this visit:  Class 1 obesity due to  excess calories with serious comorbidity and body mass index (BMI) of 31.0 to 31.9 in adult -     CBC with Differential/Platelet -     Liraglutide -Weight Management (SAXENDA) 18 MG/3ML SOPN; Inject 3 mg into the skin daily.  Dyslipidemia (high LDL; low HDL) -     Lipid Panel w/reflex Direct LDL -     atorvastatin (LIPITOR) 40 MG tablet; TAKE 1 TABLET BY MOUTH DAILY.  Screening for diabetes mellitus -     COMPLETE METABOLIC PANEL WITH GFR  Elevated fasting glucose -     Hemoglobin A1c   Doing well on saxenda. Weight stable. Goal weight of 215.  Marland Kitchen.Discussed low carb diet with 1500 calories and 80g of protein.  Exercising at least 150 minutes a week.  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  Refilled for 6 months.   Needs fasting labs.  BP controlled.  Hx of elevated fasting glucose. Will get A1C.

## 2019-06-24 ENCOUNTER — Encounter: Payer: Self-pay | Admitting: Physician Assistant

## 2019-06-24 DIAGNOSIS — Z6832 Body mass index (BMI) 32.0-32.9, adult: Secondary | ICD-10-CM | POA: Diagnosis not present

## 2019-06-24 DIAGNOSIS — E785 Hyperlipidemia, unspecified: Secondary | ICD-10-CM | POA: Diagnosis not present

## 2019-06-24 DIAGNOSIS — R7301 Impaired fasting glucose: Secondary | ICD-10-CM | POA: Diagnosis not present

## 2019-06-24 DIAGNOSIS — Z131 Encounter for screening for diabetes mellitus: Secondary | ICD-10-CM | POA: Diagnosis not present

## 2019-06-25 ENCOUNTER — Encounter: Payer: Self-pay | Admitting: Physician Assistant

## 2019-06-25 ENCOUNTER — Other Ambulatory Visit: Payer: Self-pay | Admitting: Neurology

## 2019-06-25 DIAGNOSIS — D649 Anemia, unspecified: Secondary | ICD-10-CM

## 2019-06-25 DIAGNOSIS — D509 Iron deficiency anemia, unspecified: Secondary | ICD-10-CM | POA: Insufficient documentation

## 2019-06-25 LAB — CBC WITH DIFFERENTIAL/PLATELET
Absolute Monocytes: 499 cells/uL (ref 200–950)
Basophils Absolute: 57 cells/uL (ref 0–200)
Basophils Relative: 1.1 %
Eosinophils Absolute: 57 cells/uL (ref 15–500)
Eosinophils Relative: 1.1 %
HCT: 31.9 % — ABNORMAL LOW (ref 38.5–50.0)
Hemoglobin: 10 g/dL — ABNORMAL LOW (ref 13.2–17.1)
Lymphs Abs: 1882 cells/uL (ref 850–3900)
MCH: 24.2 pg — ABNORMAL LOW (ref 27.0–33.0)
MCHC: 31.3 g/dL — ABNORMAL LOW (ref 32.0–36.0)
MCV: 77.2 fL — ABNORMAL LOW (ref 80.0–100.0)
MPV: 9.8 fL (ref 7.5–12.5)
Monocytes Relative: 9.6 %
Neutro Abs: 2704 cells/uL (ref 1500–7800)
Neutrophils Relative %: 52 %
Platelets: 376 10*3/uL (ref 140–400)
RBC: 4.13 10*6/uL — ABNORMAL LOW (ref 4.20–5.80)
RDW: 15.3 % — ABNORMAL HIGH (ref 11.0–15.0)
Total Lymphocyte: 36.2 %
WBC: 5.2 10*3/uL (ref 3.8–10.8)

## 2019-06-25 LAB — COMPLETE METABOLIC PANEL WITH GFR
AG Ratio: 1.9 (calc) (ref 1.0–2.5)
ALT: 22 U/L (ref 9–46)
AST: 14 U/L (ref 10–40)
Albumin: 4.5 g/dL (ref 3.6–5.1)
Alkaline phosphatase (APISO): 83 U/L (ref 36–130)
BUN: 18 mg/dL (ref 7–25)
CO2: 27 mmol/L (ref 20–32)
Calcium: 9.4 mg/dL (ref 8.6–10.3)
Chloride: 105 mmol/L (ref 98–110)
Creat: 0.82 mg/dL (ref 0.60–1.35)
GFR, Est African American: 123 mL/min/{1.73_m2} (ref >=60)
GFR, Est Non African American: 106 mL/min/{1.73_m2} (ref >=60)
Globulin: 2.4 g/dL (calc) (ref 1.9–3.7)
Glucose, Bld: 105 mg/dL — ABNORMAL HIGH (ref 65–99)
Potassium: 4.3 mmol/L (ref 3.5–5.3)
Sodium: 139 mmol/L (ref 135–146)
Total Bilirubin: 0.4 mg/dL (ref 0.2–1.2)
Total Protein: 6.9 g/dL (ref 6.1–8.1)

## 2019-06-25 LAB — LIPID PANEL W/REFLEX DIRECT LDL
Cholesterol: 136 mg/dL (ref <200)
HDL: 40 mg/dL (ref >=40)
LDL Cholesterol (Calc): 79 mg/dL (calc)
Non-HDL Cholesterol (Calc): 96 mg/dL (calc) (ref <130)
Total CHOL/HDL Ratio: 3.4 (calc) (ref <5.0)
Triglycerides: 85 mg/dL (ref <150)

## 2019-06-25 LAB — HEMOGLOBIN A1C
Hgb A1c MFr Bld: 5.6 % of total Hgb (ref <5.7)
Mean Plasma Glucose: 114 (calc)
eAG (mmol/L): 6.3 (calc)

## 2019-06-25 NOTE — Progress Notes (Signed)
When was last time you gave blood? It is probably that then. 12 weeks after your last blood draw lets recheck CBC and see if it normalizes. If so no need for further evaluation.

## 2019-06-25 NOTE — Progress Notes (Signed)
Jeneen Rinks,   A!C suprisingly is up a hair from last year. Still in normal range. Continue to watch sugars and carbs. Try to eat more protein.   Kidney, liver look great!   Cholesterol is fantastic.   Your hgb is low. Are you noticing any black tarry stools, abdominal pain? This can make you feel fatigued, short of breath. Sometimes it can mean you are losing blood some where? We need to send to GI to consider colonoscopy.

## 2019-07-19 ENCOUNTER — Telehealth: Payer: Self-pay | Admitting: Physician Assistant

## 2019-07-19 NOTE — Telephone Encounter (Signed)
Received Renewal prior authorization for Saxenda sent through cover my meds waiting on authorization. - CF

## 2019-07-21 NOTE — Telephone Encounter (Signed)
Received fax from [facility] needing more information filled out, [facility] signed, faxed back. - CF

## 2019-07-22 NOTE — Telephone Encounter (Signed)
Received a fax from [place] that Saxenda was approved from 07/19/2019 through 07/17/2020. Pharmacy aware and form sent to scan.

## 2019-07-28 DIAGNOSIS — D649 Anemia, unspecified: Secondary | ICD-10-CM | POA: Diagnosis not present

## 2019-07-29 ENCOUNTER — Encounter: Payer: Self-pay | Admitting: Gastroenterology

## 2019-07-29 ENCOUNTER — Other Ambulatory Visit: Payer: Self-pay | Admitting: Neurology

## 2019-07-29 DIAGNOSIS — D649 Anemia, unspecified: Secondary | ICD-10-CM

## 2019-07-29 NOTE — Progress Notes (Signed)
Jonathan Shaw,   You hemoglobin did improve but still not up to normal. It has been 3 months since last blood draw.

## 2019-08-02 ENCOUNTER — Other Ambulatory Visit: Payer: Self-pay | Admitting: Physician Assistant

## 2019-08-02 DIAGNOSIS — D509 Iron deficiency anemia, unspecified: Secondary | ICD-10-CM

## 2019-08-02 MED ORDER — FERROUS SULFATE 325 (65 FE) MG PO TBEC: 325.0000 mg | DELAYED_RELEASE_TABLET | Freq: Two times a day (BID) | ORAL | 2 refills | Status: DC

## 2019-08-02 NOTE — Progress Notes (Signed)
Is it too late to add ferritn, iron, TIBC.  Most likely this is iron def anemiasince it is coming up and not going down. Start with iron supplement for 6 weeks and then recheck. I will send to pharmacy.

## 2019-08-03 LAB — CBC WITH DIFFERENTIAL/PLATELET
Absolute Monocytes: 557 cells/uL (ref 200–950)
Basophils Absolute: 58 cells/uL (ref 0–200)
Basophils Relative: 1.1 %
Eosinophils Absolute: 58 cells/uL (ref 15–500)
Eosinophils Relative: 1.1 %
HCT: 33.3 % — ABNORMAL LOW (ref 38.5–50.0)
Hemoglobin: 10.6 g/dL — ABNORMAL LOW (ref 13.2–17.1)
Lymphs Abs: 1749 cells/uL (ref 850–3900)
MCH: 23.8 pg — ABNORMAL LOW (ref 27.0–33.0)
MCHC: 31.8 g/dL — ABNORMAL LOW (ref 32.0–36.0)
MCV: 74.8 fL — ABNORMAL LOW (ref 80.0–100.0)
MPV: 10 fL (ref 7.5–12.5)
Monocytes Relative: 10.5 %
Neutro Abs: 2878 cells/uL (ref 1500–7800)
Neutrophils Relative %: 54.3 %
Platelets: 410 10*3/uL — ABNORMAL HIGH (ref 140–400)
RBC: 4.45 10*6/uL (ref 4.20–5.80)
RDW: 15 % (ref 11.0–15.0)
Total Lymphocyte: 33 %
WBC: 5.3 10*3/uL (ref 3.8–10.8)

## 2019-08-03 LAB — IRON,TIBC AND FERRITIN PANEL

## 2019-08-04 ENCOUNTER — Other Ambulatory Visit: Payer: Self-pay | Admitting: Neurology

## 2019-08-04 DIAGNOSIS — D649 Anemia, unspecified: Secondary | ICD-10-CM

## 2019-08-06 ENCOUNTER — Ambulatory Visit: Payer: BC Managed Care – PPO | Admitting: Gastroenterology

## 2019-08-11 DIAGNOSIS — Z23 Encounter for immunization: Secondary | ICD-10-CM | POA: Diagnosis not present

## 2019-08-18 ENCOUNTER — Telehealth: Payer: Self-pay | Admitting: Gastroenterology

## 2019-08-18 ENCOUNTER — Encounter: Payer: Self-pay | Admitting: Gastroenterology

## 2019-08-18 ENCOUNTER — Ambulatory Visit: Payer: BC Managed Care – PPO | Admitting: Gastroenterology

## 2019-08-18 ENCOUNTER — Other Ambulatory Visit: Payer: Self-pay

## 2019-08-18 VITALS — BP 116/82 | HR 89 | Temp 97.8°F | Ht 71.0 in | Wt 229.2 lb

## 2019-08-18 DIAGNOSIS — D509 Iron deficiency anemia, unspecified: Secondary | ICD-10-CM | POA: Diagnosis not present

## 2019-08-18 DIAGNOSIS — Z01818 Encounter for other preprocedural examination: Secondary | ICD-10-CM

## 2019-08-18 MED ORDER — CLENPIQ 10-3.5-12 MG-GM -GM/160ML PO SOLN: 1.0000 | Freq: Once | ORAL | 0 refills | Status: AC

## 2019-08-18 NOTE — Telephone Encounter (Signed)
[facility] called to inform that clenpiq is not covered by pt's insurance. They cover suprep with $60 copay. They also cover golytely but these kind of prep are in back order.

## 2019-08-18 NOTE — Telephone Encounter (Signed)
Patient came by the office to pock up Clinpiq sample

## 2019-08-18 NOTE — Patient Instructions (Signed)
You have been scheduled for an endoscopy and colonoscopy. Please follow the written instructions given to you at your visit today. Please pick up your prep supplies at the pharmacy within the next 1-3 days. If you use inhalers (even only as needed), please bring them with you on the day of your procedure. Your physician has requested that you go to www.startemmi.com and enter the access code given to you at your visit today. This web site gives a general overview about your procedure. However, you should still follow specific instructions given to you by our office regarding your preparation for the procedure.  Your provider has requested that you go to the basement level for lab work at [address] in [place] [address]. Press "B" on the elevator. The lab is located at the first door on the left as you exit the elevator.  Due to recent changes in healthcare laws, you may see the results of your imaging and laboratory studies on MyChart before your provider has had a chance to review them.  We understand that in some cases there may be results that are confusing or concerning to you. Not all laboratory results come back in the same time frame and the provider may be waiting for multiple results in order to interpret others.  Please give us 48 hours in order for your provider to thoroughly review all the results before contacting the office for clarification of your results.   It was a pleasure to see you today!  Vito Cirigliano, D.O.  

## 2019-08-18 NOTE — Progress Notes (Signed)
Chief Complaint: Microcytic anemia  Referring Provider:     Donella Stade, PA-C  HPI:    Jonathan Shaw is a 49 y.o. male with history of hyperlipidemia referred to [facility] for evaluation of microcytic anemia.  States that he otherwise feels well.  No GI symptoms.  No hematochezia, melena, reflux, dysphagia, abdominal pain, night sweats, fever, chills.  Donates blood frequently, and noticed anemia in 04/2019 when trying to donate (for his 99th time). Last donated 02/2019, but has remained anemic since then.   Evaluation to date: -08/03/2019: H/H 10.6/33.3, MCV/RDW 74.8/15 -06/24/2019: H/H 10/32, MCV/RDW 77.2/15.3 -03/17/2018: H/H 13.6/39.1, MCV/RDW 86.1/13.2  Was started on iron supplement 07/2019, but significnat GI intolerance.  Aside from chicken, does not eat much in the way of iron rich foods, but has taken daily MVI and vitamin B12 since his 65s.  No iron panel for review (was ordered, but not completed by lab?).  No abdominal imaging for review.  Thinks he may have had an EGD 2 years ago during admission for appendicitis with rupture. No previous colonoscopy.  Was started on Saxenda with good weight loss. BMI currently 31.97.  Family history unknown.   Past Medical History:  Diagnosis Date  . High cholesterol      Past Surgical History:  Procedure Laterality Date  . APPENDECTOMY     Family History  Problem Relation Age of Onset  . Other Mother   . Other Father    Social History   Tobacco Use  . Smoking status: Never Smoker  . Smokeless tobacco: Never Used  Substance Use Topics  . Alcohol use: Yes  . Drug use: No   Current Outpatient Medications  Medication Sig Dispense Refill  . atorvastatin (LIPITOR) 40 MG tablet TAKE 1 TABLET BY MOUTH DAILY. 90 tablet 3  . cetirizine (ZYRTEC) 5 MG tablet Take 5 mg by mouth daily.    . ferrous sulfate 325 (65 FE) MG EC tablet Take 1 tablet (325 mg total) by mouth 2 (two) times daily with a  meal. 60 tablet 2  . fluticasone (FLONASE) 50 MCG/ACT nasal spray Place 2 sprays into both nostrils daily. 16 g 6  . Insulin Pen Needle (NOVOFINE) 32G X 6 MM MISC Inject into skin subcutaneously once daily. Use with Saxenda pen. 100 each 5  . Liraglutide -Weight Management (SAXENDA) 18 MG/3ML SOPN Inject 3 mg into the skin daily. 5 pen 5  . Multiple Vitamin (MULTIVITAMIN) tablet Take 1 tablet by mouth daily.    . vitamin B-12 (CYANOCOBALAMIN) 500 MCG tablet Take 500 mcg by mouth daily.    . vitamin C (ASCORBIC ACID) 500 MG tablet Take 500 mg by mouth daily.     No current facility-administered medications for this visit.   No Known Allergies   Review of Systems: All systems reviewed and negative except where noted in HPI.     Physical Exam:    Wt Readings from Last 3 Encounters:  08/18/19 229 lb 4 oz (104 kg)  06/23/19 224 lb (101.6 kg)  07/28/18 216 lb (98 kg)    BP 116/82   Pulse 89   Ht 5\' 11"  (1.803 m)   Wt 229 lb 4 oz (104 kg)   BMI 31.97 kg/m  Constitutional:  Pleasant, in no acute distress. Psychiatric: Normal mood and affect. Behavior is normal. EENT: Pupils normal.  Conjunctivae are normal. No scleral icterus. Neck supple. No cervical  LAD. Cardiovascular: Normal rate, regular rhythm. No edema Pulmonary/chest: Effort normal and breath sounds normal. No wheezing, rales or rhonchi. Abdominal: Soft, nondistended, nontender. Bowel sounds active throughout. There are no masses palpable. No hepatomegaly. Neurological: Alert and oriented to person place and time. Skin: Skin is warm and dry. No rashes noted.   ASSESSMENT AND PLAN;   1) Microcytic Anemia: High suspicion for iron deficiency anemia based on microcytosis.  Initially discovered when trying to donate blood.  Since then, has not donated any blood, but still with microcytic anemia.  Was recently started on oral iron supplementation, but significant GI upset.  Plan for the following:  - EGD with duodenal bxs -  Colonoscopy - Iron, B12, folate, Vit D -If endoscopic evaluation unrevealing, and with confirmed IDA on labs, discussed VCE for small bowel interrogation -Pending lab confirmation, can trial alternate p.o. iron supplement or change to qod dosing.  If still intolerant, IV iron  The indications, risks, and benefits of EGD and colonoscopy were explained to the patient in detail. Risks include but are not limited to bleeding, perforation, adverse reaction to medications, and cardiopulmonary compromise. Sequelae include but are not limited to the possibility of surgery, hositalization, and mortality. The patient verbalized understanding and wished to proceed. All questions answered, referred to scheduler and bowel prep ordered. Further recommendations pending results of the exam.    Lavena Bullion, DO, FACG  08/18/2019, 8:36 AM   Donella Stade, PA-C

## 2019-08-23 ENCOUNTER — Encounter: Payer: Self-pay | Admitting: Gastroenterology

## 2019-09-01 DIAGNOSIS — Z23 Encounter for immunization: Secondary | ICD-10-CM | POA: Diagnosis not present

## 2019-09-02 ENCOUNTER — Other Ambulatory Visit: Payer: Self-pay | Admitting: Gastroenterology

## 2019-09-02 ENCOUNTER — Ambulatory Visit (INDEPENDENT_AMBULATORY_CARE_PROVIDER_SITE_OTHER): Payer: BC Managed Care – PPO

## 2019-09-02 DIAGNOSIS — Z1159 Encounter for screening for other viral diseases: Secondary | ICD-10-CM

## 2019-09-02 LAB — SARS CORONAVIRUS 2 (TAT 6-24 HRS): SARS Coronavirus 2: NEGATIVE

## 2019-09-06 ENCOUNTER — Ambulatory Visit (AMBULATORY_SURGERY_CENTER): Payer: BC Managed Care – PPO | Admitting: Gastroenterology

## 2019-09-06 ENCOUNTER — Other Ambulatory Visit (INDEPENDENT_AMBULATORY_CARE_PROVIDER_SITE_OTHER): Payer: BC Managed Care – PPO

## 2019-09-06 ENCOUNTER — Other Ambulatory Visit: Payer: Self-pay

## 2019-09-06 ENCOUNTER — Encounter: Payer: Self-pay | Admitting: Gastroenterology

## 2019-09-06 VITALS — BP 116/70 | HR 74 | Temp 96.8°F | Resp 15 | Ht 71.0 in | Wt 229.0 lb

## 2019-09-06 DIAGNOSIS — D509 Iron deficiency anemia, unspecified: Secondary | ICD-10-CM | POA: Diagnosis not present

## 2019-09-06 DIAGNOSIS — K2950 Unspecified chronic gastritis without bleeding: Secondary | ICD-10-CM | POA: Diagnosis not present

## 2019-09-06 DIAGNOSIS — K449 Diaphragmatic hernia without obstruction or gangrene: Secondary | ICD-10-CM

## 2019-09-06 DIAGNOSIS — B9681 Helicobacter pylori [H. pylori] as the cause of diseases classified elsewhere: Secondary | ICD-10-CM

## 2019-09-06 DIAGNOSIS — K635 Polyp of colon: Secondary | ICD-10-CM | POA: Diagnosis not present

## 2019-09-06 DIAGNOSIS — D125 Benign neoplasm of sigmoid colon: Secondary | ICD-10-CM

## 2019-09-06 DIAGNOSIS — K5730 Diverticulosis of large intestine without perforation or abscess without bleeding: Secondary | ICD-10-CM | POA: Diagnosis not present

## 2019-09-06 DIAGNOSIS — K2970 Gastritis, unspecified, without bleeding: Secondary | ICD-10-CM

## 2019-09-06 DIAGNOSIS — K640 First degree hemorrhoids: Secondary | ICD-10-CM

## 2019-09-06 HISTORY — PX: COLONOSCOPY: SHX174

## 2019-09-06 HISTORY — PX: UPPER GASTROINTESTINAL ENDOSCOPY: SHX188

## 2019-09-06 LAB — IBC + FERRITIN
Ferritin: 23.7 ng/mL (ref 22.0–322.0)
Iron: 39 ug/dL — ABNORMAL LOW (ref 42–165)
Saturation Ratios: 10.7 % — ABNORMAL LOW (ref 20.0–50.0)
Transferrin: 260 mg/dL (ref 212.0–360.0)

## 2019-09-06 LAB — FOLATE: Folate: >23.7 ng/mL (ref >5.9)

## 2019-09-06 LAB — VITAMIN B12: Vitamin B-12: 632 pg/mL (ref 211–911)

## 2019-09-06 LAB — VITAMIN D 25 HYDROXY (VIT D DEFICIENCY, FRACTURES): VITD: 18.21 ng/mL — ABNORMAL LOW (ref 30.00–100.00)

## 2019-09-06 MED ORDER — SODIUM CHLORIDE 0.9 % IV SOLN: 500.0000 mL | Freq: Once | INTRAVENOUS | Status: DC

## 2019-09-06 NOTE — Progress Notes (Signed)
Temp check by:JB Vital check by:KA  The medical and surgical history was reviewed and verified with the patient.

## 2019-09-06 NOTE — Patient Instructions (Signed)
Information on polyps, diverticulosis, gastritis and hemorrhoids given to you today.  Await pathology results.  Labwork to be done on the way out today.  Resume oral iron therapy    YOU HAD AN ENDOSCOPIC PROCEDURE TODAY AT [facility]:   Refer to the procedure report that was given to you for any specific questions about what was found during the examination.  If the procedure report does not answer your questions, please call your gastroenterologist to clarify.  If you requested that your care partner not be given the details of your procedure findings, then the procedure report has been included in a sealed envelope for you to review at your convenience later.  YOU SHOULD EXPECT: Some feelings of bloating in the abdomen. Passage of more gas than usual.  Walking can help get rid of the air that was put into your GI tract during the procedure and reduce the bloating. If you had a lower endoscopy (such as a colonoscopy or flexible sigmoidoscopy) you may notice spotting of blood in your stool or on the toilet paper. If you underwent a bowel prep for your procedure, you may not have a normal bowel movement for a few days.  Please Note:  You might notice some irritation and congestion in your nose or some drainage.  This is from the oxygen used during your procedure.  There is no need for concern and it should clear up in a day or so.  SYMPTOMS TO REPORT IMMEDIATELY:   Following lower endoscopy (colonoscopy or flexible sigmoidoscopy):  Excessive amounts of blood in the stool  Significant tenderness or worsening of abdominal pains  Swelling of the abdomen that is new, acute  Fever of 100F or higher   Following upper endoscopy (EGD)  Vomiting of blood or coffee ground material  New chest pain or pain under the shoulder blades  Painful or persistently difficult swallowing  New shortness of breath  Fever of 100F or higher  Black, tarry-looking stools  For urgent or  emergent issues, a gastroenterologist can be reached at any hour by calling [phone]. Do not use MyChart messaging for urgent concerns.    DIET:  We do recommend a small meal at first, but then you may proceed to your regular diet.  Drink plenty of fluids but you should avoid alcoholic beverages for 24 hours.  ACTIVITY:  You should plan to take it easy for the rest of today and you should NOT DRIVE or use heavy machinery until tomorrow (because of the sedation medicines used during the test).    FOLLOW UP: Our staff will call the number listed on your records 48-72 hours following your procedure to check on you and address any questions or concerns that you may have regarding the information given to you following your procedure. If we do not reach you, we will leave a message.  We will attempt to reach you two times.  During this call, we will ask if you have developed any symptoms of COVID 19. If you develop any symptoms (ie: fever, flu-like symptoms, shortness of breath, cough etc.) before then, please call [phone].  If you test positive for Covid 19 in the 2 weeks post procedure, please call and report this information to [place].    If any biopsies were taken you will be contacted by phone or by letter within the next 1-3 weeks.  Please call us at [phone] if you have not heard about the biopsies in 3 weeks.  SIGNATURES/CONFIDENTIALITY: You and/or your care partner have signed paperwork which will be entered into your electronic medical record.  These signatures attest to the fact that that the information above on your After Visit Summary has been reviewed and is understood.  Full responsibility of the confidentiality of this discharge information lies with you and/or your care-partner.

## 2019-09-06 NOTE — Op Note (Signed)
[facility] Patient Name: Jonathan Shaw Procedure Date: 09/06/2019 10:39 AM MRN: [id number] Endoscopist: Gerrit Heck , MD Age: 49 Referring MD:  Date of Birth: July 31, 1970 Gender: Male Account #: [id number] Procedure:                Upper GI endoscopy Indications:              Iron deficiency anemia/Microcytic anemia. No overt                            GI blood loss. Medicines:                Monitored Anesthesia Care Procedure:                Pre-Anesthesia Assessment:                           - Prior to the procedure, a History and Physical                            was performed, and patient medications and                            allergies were reviewed. The patient's tolerance of                            previous anesthesia was also reviewed. The risks                            and benefits of the procedure and the sedation                            options and risks were discussed with the patient.                            All questions were answered, and informed consent                            was obtained. Prior Anticoagulants: The patient has                            taken no previous anticoagulant or antiplatelet                            agents. ASA Grade Assessment: II - A patient with                            mild systemic disease. After reviewing the risks                            and benefits, the patient was deemed in                            satisfactory condition to undergo the procedure.  After obtaining informed consent, the endoscope was                            passed under direct vision. Throughout the                            procedure, the patient's blood pressure, pulse, and                            oxygen saturations were monitored continuously. The                            Endoscope was introduced through the mouth, and                            advanced to the second part of duodenum.  The upper                            GI endoscopy was accomplished without difficulty.                            The patient tolerated the procedure well. Scope In: Scope Out: Findings:                 The examined esophagus was normal.                           The Z-line was regular and was found 40 cm from the                            incisors.                           The gastroesophageal flap valve was visualized                            endoscopically and classified as Hill Grade III                            (minimal fold, loose to endoscope, hiatal hernia                            likely).                           Scattered mild inflammation characterized by                            erythema was found in the gastric body and in the                            gastric antrum. Biopsies were taken with a cold                            forceps for Helicobacter pylori testing. Estimated  blood loss was minimal.                           The duodenal bulb, first portion of the duodenum                            and second portion of the duodenum were normal.                            Biopsies for histology were taken with a cold                            forceps for evaluation of celiac disease. Estimated                            blood loss was minimal. Complications:            No immediate complications. Estimated Blood Loss:     Estimated blood loss was minimal. Impression:               - Normal esophagus.                           - Z-line regular, 40 cm from the incisors.                           - Gastroesophageal flap valve classified as Hill                            Grade III (minimal fold, loose to endoscope, hiatal                            hernia likely).                           - Gastritis. Biopsied.                           - Normal duodenal bulb, first portion of the                            duodenum and second portion  of the duodenum.                            Biopsied. Recommendation:           - Patient has a contact number available for                            emergencies. The signs and symptoms of potential                            delayed complications were discussed with the                            patient. Return to normal activities tomorrow.  Written discharge instructions were provided to the                            patient.                           - Resume previous diet.                           - Continue present medications.                           - Await pathology results.                           - Perform a colonoscopy today. Additional                            recommendations pending colonoscopy findings. Gerrit Heck, MD 09/06/2019 11:46:26 AM

## 2019-09-06 NOTE — Progress Notes (Signed)
Report given to PACU, vss 

## 2019-09-06 NOTE — Progress Notes (Signed)
Called to room to assist during endoscopic procedure.  Patient ID and intended procedure confirmed with present staff. Received instructions for my participation in the procedure from the performing physician.  

## 2019-09-06 NOTE — Progress Notes (Signed)
[facility] 

## 2019-09-06 NOTE — Op Note (Signed)
[facility] Patient Name: Jonathan Shaw Procedure Date: 09/06/2019 10:39 AM MRN: [id number] Endoscopist: Gerrit Heck , MD Age: 49 Referring MD:  Date of Birth: 15-Dec-1970 Gender: Male Account #: [id number] Procedure:                Colonoscopy Indications:              Iron deficiency anemia/Microcytic anemia. No overt                            GI bleeding. No prior colonoscopy. Medicines:                Monitored Anesthesia Care Procedure:                Pre-Anesthesia Assessment:                           - Prior to the procedure, a History and Physical                            was performed, and patient medications and                            allergies were reviewed. The patient's tolerance of                            previous anesthesia was also reviewed. The risks                            and benefits of the procedure and the sedation                            options and risks were discussed with the patient.                            All questions were answered, and informed consent                            was obtained. Prior Anticoagulants: The patient has                            taken no previous anticoagulant or antiplatelet                            agents. ASA Grade Assessment: II - A patient with                            mild systemic disease. After reviewing the risks                            and benefits, the patient was deemed in                            satisfactory condition to undergo the procedure.  After obtaining informed consent, the colonoscope                            was passed under direct vision. Throughout the                            procedure, the patient's blood pressure, pulse, and                            oxygen saturations were monitored continuously. The                            Colonoscope was introduced through the anus and                            advanced to the the terminal  ileum. The colonoscopy                            was performed without difficulty. The patient                            tolerated the procedure well. The quality of the                            bowel preparation was excellent. The terminal                            ileum, ileocecal valve, appendiceal orifice, and                            rectum were photographed. Scope In: 11:25:23 AM Scope Out: 11:41:09 AM Scope Withdrawal Time: 0 hours 13 minutes 26 seconds  Total Procedure Duration: 0 hours 15 minutes 46 seconds  Findings:                 The perianal and digital rectal examinations were                            normal.                           A 3 mm polyp was found in the sigmoid colon. The                            polyp was sessile. The polyp was removed with a                            cold snare. Resection and retrieval were complete.                            Estimated blood loss was minimal.                           Multiple small and large-mouthed diverticula were  found in the entire colon.                           Non-bleeding internal hemorrhoids were found during                            retroflexion. The hemorrhoids were small.                           The exam was otherwise normal throughout the                            remainder of the colon. There were no areas of                            mucosal erythema, erosions, or ulceration and no                            stigmata of recent bleeding noted on this study.                           The terminal ileum appeared normal. Complications:            No immediate complications. Estimated Blood Loss:     Estimated blood loss was minimal. Impression:               - One 3 mm polyp in the sigmoid colon, removed with                            a cold snare. Resected and retrieved.                           - Diverticulosis in the entire examined colon.                            - Non-bleeding internal hemorrhoids.                           - The examined portion of the ileum was normal. Recommendation:           - Patient has a contact number available for                            emergencies. The signs and symptoms of potential                            delayed complications were discussed with the                            patient. Return to normal activities tomorrow.                            Written discharge instructions were provided to the  patient.                           - Resume previous diet.                           - Continue present medications.                           - Await pathology results.                           - Repeat colonoscopy in 7-10 years for surveillance                            based on pathology results.                           - Check iron panel, ferritin, B12, folate, and                            Vitamin D at the next available appointment.                           - Resume oral iron therapy.                           - Plan for repeat CBC and iron panel in 2 months.                            Depending on biopsy results and response to                            therapy, may consider small bowel interrogation                            with Video Capsule Endoscopy (VCE).                           - Return to GI clinic in 2 months. Gerrit Heck, MD 09/06/2019 11:52:23 AM

## 2019-09-08 ENCOUNTER — Telehealth: Payer: Self-pay

## 2019-09-08 ENCOUNTER — Other Ambulatory Visit: Payer: Self-pay

## 2019-09-08 DIAGNOSIS — D509 Iron deficiency anemia, unspecified: Secondary | ICD-10-CM

## 2019-09-08 NOTE — Telephone Encounter (Signed)
  Follow up Call-  Call back number 09/06/2019  Post procedure Call Back phone  # [phone]  Permission to leave phone message Yes  Some recent data might be hidden     Patient questions:  Do you have a fever, pain , or abdominal swelling? No. Pain Score  0 *  Have you tolerated food without any problems? Yes.    Have you been able to return to your normal activities? Yes.    Do you have any questions about your discharge instructions: Diet   No. Medications  No. Follow up visit  No.  Do you have questions or concerns about your Care? No.  Actions: * If pain score is 4 or above: No action needed, pain <4.  1. Have you developed a fever since your procedure? no  2.   Have you had an respiratory symptoms (SOB or cough) since your procedure? no  3.   Have you tested positive for COVID 19 since your procedure no  4.   Have you had any family members/close contacts diagnosed with the COVID 19 since your procedure?  no   If yes to any of these questions please route to Joylene John, RN and Erenest Rasher, RN

## 2019-09-10 ENCOUNTER — Other Ambulatory Visit: Payer: Self-pay | Admitting: Gastroenterology

## 2019-09-10 DIAGNOSIS — B9681 Helicobacter pylori [H. pylori] as the cause of diseases classified elsewhere: Secondary | ICD-10-CM

## 2019-09-10 MED ORDER — METRONIDAZOLE 250 MG PO TABS: 250.0000 mg | ORAL_TABLET | Freq: Four times a day (QID) | ORAL | 0 refills | Status: AC

## 2019-09-10 MED ORDER — DOXYCYCLINE HYCLATE 100 MG PO TABS: 100.0000 mg | ORAL_TABLET | Freq: Two times a day (BID) | ORAL | 0 refills | Status: AC

## 2019-09-10 MED ORDER — BISMUTH SUBSALICYLATE 262 MG PO CHEW: 524.0000 mg | CHEWABLE_TABLET | Freq: Four times a day (QID) | ORAL | 0 refills | Status: AC

## 2019-10-18 ENCOUNTER — Other Ambulatory Visit: Payer: BC Managed Care – PPO

## 2019-10-18 DIAGNOSIS — B9681 Helicobacter pylori [H. pylori] as the cause of diseases classified elsewhere: Secondary | ICD-10-CM | POA: Diagnosis not present

## 2019-10-18 DIAGNOSIS — K2970 Gastritis, unspecified, without bleeding: Secondary | ICD-10-CM | POA: Diagnosis not present

## 2019-10-19 LAB — HELICOBACTER PYLORI  SPECIAL ANTIGEN
MICRO NUMBER:: 10431831
SPECIMEN QUALITY: ADEQUATE

## 2019-10-27 ENCOUNTER — Other Ambulatory Visit (INDEPENDENT_AMBULATORY_CARE_PROVIDER_SITE_OTHER): Payer: BC Managed Care – PPO

## 2019-10-27 DIAGNOSIS — D509 Iron deficiency anemia, unspecified: Secondary | ICD-10-CM

## 2019-10-27 LAB — CBC WITH DIFFERENTIAL/PLATELET
Basophils Absolute: 0.1 10*3/uL (ref 0.0–0.1)
Basophils Relative: 0.9 % (ref 0.0–3.0)
Eosinophils Absolute: 0.1 10*3/uL (ref 0.0–0.7)
Eosinophils Relative: 1.6 % (ref 0.0–5.0)
HCT: 46.3 % (ref 39.0–52.0)
Hemoglobin: 15.9 g/dL (ref 13.0–17.0)
Lymphocytes Relative: 38.5 % (ref 12.0–46.0)
Lymphs Abs: 2.2 10*3/uL (ref 0.7–4.0)
MCHC: 34.3 g/dL (ref 30.0–36.0)
MCV: 86.7 fl (ref 78.0–100.0)
Monocytes Absolute: 0.5 10*3/uL (ref 0.1–1.0)
Monocytes Relative: 9 % (ref 3.0–12.0)
Neutro Abs: 2.9 10*3/uL (ref 1.4–7.7)
Neutrophils Relative %: 50 % (ref 43.0–77.0)
Platelets: 315 10*3/uL (ref 150.0–400.0)
RBC: 5.33 Mil/uL (ref 4.22–5.81)
RDW: 19.3 % — ABNORMAL HIGH (ref 11.5–15.5)
WBC: 5.8 10*3/uL (ref 4.0–10.5)

## 2019-10-27 LAB — FOLATE: Folate: >23.7 ng/mL (ref >5.9)

## 2019-10-27 LAB — VITAMIN B12: Vitamin B-12: 802 pg/mL (ref 211–911)

## 2019-10-27 LAB — VITAMIN D 25 HYDROXY (VIT D DEFICIENCY, FRACTURES): VITD: 35.16 ng/mL (ref 30.00–100.00)

## 2019-10-28 LAB — IRON,TIBC AND FERRITIN PANEL
%SAT: 35 % (calc) (ref 20–48)
Ferritin: 37 ng/mL — ABNORMAL LOW (ref 38–380)
Iron: 122 ug/dL (ref 50–180)
TIBC: 347 mcg/dL (calc) (ref 250–425)

## 2019-11-01 ENCOUNTER — Other Ambulatory Visit: Payer: Self-pay

## 2019-11-01 ENCOUNTER — Ambulatory Visit (INDEPENDENT_AMBULATORY_CARE_PROVIDER_SITE_OTHER): Payer: BC Managed Care – PPO | Admitting: Gastroenterology

## 2019-11-01 ENCOUNTER — Encounter: Payer: Self-pay | Admitting: Gastroenterology

## 2019-11-01 VITALS — BP 118/82 | HR 92 | Temp 97.3°F | Ht 71.0 in | Wt 244.1 lb

## 2019-11-01 DIAGNOSIS — B9681 Helicobacter pylori [H. pylori] as the cause of diseases classified elsewhere: Secondary | ICD-10-CM

## 2019-11-01 DIAGNOSIS — E559 Vitamin D deficiency, unspecified: Secondary | ICD-10-CM

## 2019-11-01 DIAGNOSIS — K219 Gastro-esophageal reflux disease without esophagitis: Secondary | ICD-10-CM

## 2019-11-01 DIAGNOSIS — K2970 Gastritis, unspecified, without bleeding: Secondary | ICD-10-CM

## 2019-11-01 DIAGNOSIS — D509 Iron deficiency anemia, unspecified: Secondary | ICD-10-CM

## 2019-11-01 NOTE — Progress Notes (Signed)
P  Chief Complaint:    Iron deficiency anemia, vitamin D deficiency, H. pylori gastritis  GI History: 49 y.o. male with history of hyperlipidemia  initially referred to GI clinic on 08/18/2019 for evaluation of microcytic anemia.  No GI symptoms.  Donates blood frequently, and noticed anemia in 04/2019 when trying to donate (for his 99th time). Last donated 02/2019, but has remained anemic since then.   Was started on iron supplement 07/2019, but significnat GI intolerance.  Aside from chicken, does not eat much in the way of iron rich foods, but has taken daily MVI and vitamin B12 since his 31s.  Evaluation to date: -03/17/2018: H/H 13.6/39.1, MCV/RDW 86.1/13.2 -06/24/2019: H/H 10/32, MCV/RDW 77.2/15.3 -08/03/2019: H/H 10.6/33.3, MCV/RDW 74.8/15 -09/06/2019: Ferritin 23.7, iron 39, sat 10.7%, Vitamin D 18.2, normal B12/folate.  Started ergocalciferol and resume oral iron therapy -EGD (09/06/2019, Dr. Bryan Lemma): Normal esophagus, mild H. pylori gastritis without ulcers, normal duodenum (normal biopsies) -Colonoscopy (09/06/2019, Dr. Bryan Lemma): 3 mm hyperplastic polyp, pandiverticulosis, internal hemorrhoids.  Normal TI.  Repeat 10 years -Treated with quadruple therapy for H. pylori with eradication of stool study 10/18/2019 -10/27/2019: H/H 15.9/46.3, ferritin 37, iron 122, TIBC 347, sat 35%, Vitamin D 35.  Normal B12/folate   HPI:     Patient is a 49 y.o. male presenting to the Gastroenterology Clinic for follow-up.  States that he feels well and without any complaints today.  Complete H. pylori quadruple therapy as above, with confirmation of eradication on subsequent stool testing.  Has had a nice rebound and H/H and iron indices with oral iron therapy, with normalization of vitamin D as outlined above.  Today, he does state that he had been having reflux symptoms (heartburn, regurgitation) prior to this evaluation.  Was taking Tums as needed.  Has since had resolution of all reflux  symptoms.  No esophagitis noted at time of EGD.  Gated blood donation earlier this month issue (hemoglobin was 16 per patient), which was his 100 donation!  Review of systems:     No chest pain, no SOB, no fevers, no urinary sx   Past Medical History:  Diagnosis Date  . High cholesterol     Patient's surgical history, family medical history, social history, medications and allergies were all reviewed in Epic    Current Outpatient Medications  Medication Sig Dispense Refill  . atorvastatin (LIPITOR) 40 MG tablet TAKE 1 TABLET BY MOUTH DAILY. 90 tablet 3  . cetirizine (ZYRTEC) 5 MG tablet Take 5 mg by mouth daily.    . ferrous sulfate 325 (65 FE) MG EC tablet Take 1 tablet (325 mg total) by mouth 2 (two) times daily with a meal. 60 tablet 2  . fluticasone (FLONASE) 50 MCG/ACT nasal spray Place 2 sprays into both nostrils daily. 16 g 6  . Liraglutide -Weight Management (SAXENDA) 18 MG/3ML SOPN Inject 3 mg into the skin daily. 5 pen 5  . Multiple Vitamin (MULTIVITAMIN) tablet Take 1 tablet by mouth daily.    . vitamin B-12 (CYANOCOBALAMIN) 500 MCG tablet Take 500 mcg by mouth daily.    . vitamin C (ASCORBIC ACID) 500 MG tablet Take 500 mg by mouth daily.     No current facility-administered medications for this visit.    Physical Exam:     BP 118/82   Pulse 92   Temp (!) 97.3 F (36.3 C)   Ht 5\' 11"  (1.803 m)   Wt 244 lb 2 oz (110.7 kg)   BMI 34.05 kg/m  GENERAL:  Pleasant male in NAD PSYCH: : Cooperative, normal affect NEURO: Alert and oriented x 3, no focal neurologic deficits   IMPRESSION and PLAN:    1) H. pylori gastritis: -Completed quadruple therapy with confirmation of eradication on subsequent stool testing  2) Iron deficiency anemia: -Has had robust response to oral iron therapy.  Suspect secondary to H. pylori.  No issues with iron absorption -Stopping oral iron today.  Repeat CBC and iron panel in 3 months.  If stable, no ongoing evaluation needed.  If  ferritin deficient, can plan for VCE for small bowel interrogation  3) Vitamin D deficiency: -Good response to ergocalciferol -Resume OTC vitamin D 800 IU/day  4) GERD: -Intermittent reflux symptoms, previously treated with on-demand Tums.  Has had complete resolution of reflux symptoms since treatment of H. pylori.  No longer taking any PPI (just the omeprazole x14 days during H. pylori treatment). -Continue antireflux lifestyle/dietary modifications, with avoidance of exacerbating foods -If return of reflux symptoms, and taking on-demand antacids >2 days/week, would be reasonable to start daily PPI low-dose.  Can call me to discuss if this should occur  Repeat labs in 3 months as outlined above, otherwise can follow-up as needed  I spent 22 minutes of time, including independent review of results as outlined above, communicating results with the patient directly, face-to-face time with the patient, coordinating care, ordering studies and medications as appropriate, and documentation.            Lavena Bullion ,DO, FACG 11/01/2019, 1:29 PM

## 2019-11-01 NOTE — Patient Instructions (Signed)
Your provider has requested that you go to the basement level for lab work at [facility] in [place] [address]. Press "B" on the elevator. The lab is located at the first door on the left as you exit the elevator. In 3 months (August)  Return to office as needed   It was a pleasure to see you today!  Vito Cirigliano, D.O.

## 2019-12-21 ENCOUNTER — Ambulatory Visit (INDEPENDENT_AMBULATORY_CARE_PROVIDER_SITE_OTHER): Payer: BC Managed Care – PPO | Admitting: Physician Assistant

## 2019-12-21 ENCOUNTER — Encounter: Payer: Self-pay | Admitting: Physician Assistant

## 2019-12-21 DIAGNOSIS — E6609 Other obesity due to excess calories: Secondary | ICD-10-CM

## 2019-12-21 DIAGNOSIS — Z6831 Body mass index (BMI) 31.0-31.9, adult: Secondary | ICD-10-CM | POA: Diagnosis not present

## 2019-12-21 MED ORDER — SAXENDA 18 MG/3ML ~~LOC~~ SOPN: 3.0000 mg | PEN_INJECTOR | Freq: Every day | SUBCUTANEOUS | 5 refills | Status: DC

## 2019-12-21 NOTE — Progress Notes (Signed)
   Subjective:    Patient ID: Jonathan Shaw, male    DOB: 08-06-1970, 49 y.o.   MRN: [id number]  HPI  Pt is a 49 yo obese male with hx if IDA and h.pylori who presents to the clinic to get medication refilled.   He is doing well. hpylori has resolved. He still takes as needed omeprazole for any reflux. He has labs done with Dr. Clayton Shaw at [facility].   He continues to take saxenda daily. Helps so much with weight. He has lost 20 more lbs. He is doing great. Goal weight 200. Not exercising.   .. Active Ambulatory Problems    Diagnosis Date Noted  . Seasonal allergies 10/11/2014  . Class 1 obesity due to excess calories with serious comorbidity and body mass index (BMI) of 31.0 to 31.9 in adult 10/11/2014  . Abnormal weight gain 10/11/2014  . Hyperlipidemia 10/14/2014  . Dyslipidemia (high LDL; low HDL) 10/13/2015  . Constipation 11/21/2015  . Heel pain 11/21/2015  . Elevated fasting glucose 02/21/2016  . Non-recurrent unilateral inguinal hernia without obstruction or gangrene 07/28/2018  . Diverticulosis 07/28/2018  . Snoring 03/19/2019  . Microcytic anemia 06/25/2019   Resolved Ambulatory Problems    Diagnosis Date Noted  . Pharyngitis 05/27/2009  . Swollen uvula 03/19/2019   Past Medical History:  Diagnosis Date  . High cholesterol      Review of Systems  All other systems reviewed and are negative.      Objective:   Physical Exam Vitals reviewed.  Constitutional:      Appearance: Normal appearance. He is obese.  Cardiovascular:     Rate and Rhythm: Normal rate and regular rhythm.     Pulses: Normal pulses.  Pulmonary:     Effort: Pulmonary effort is normal.     Breath sounds: Normal breath sounds.  Abdominal:     General: Bowel sounds are normal. There is no distension.     Palpations: Abdomen is soft.     Tenderness: There is no abdominal tenderness. There is no guarding.  Neurological:     General: No focal deficit present.     Mental Status: He is alert and  oriented to person, place, and time.  Psychiatric:        Mood and Affect: Mood normal.        Behavior: Behavior normal.           Assessment & Plan:  .Marland KitchenCordai was seen today for weight check.  Diagnoses and all orders for this visit:  Class 1 obesity due to excess calories with serious comorbidity and body mass index (BMI) of 31.0 to 31.9 in adult -     Liraglutide -Weight Management (SAXENDA) 18 MG/3ML SOPN; Inject 0.5 mLs (3 mg total) into the skin daily.   Labs UTD.  Refilled saxenda. Still in obesity category. Work on [employer] and regular exercise.  Follow up in 6 months.  Goal weight 200lbs.

## 2020-02-17 DIAGNOSIS — Z20822 Contact with and (suspected) exposure to covid-19: Secondary | ICD-10-CM | POA: Diagnosis not present

## 2020-02-17 DIAGNOSIS — Z03818 Encounter for observation for suspected exposure to other biological agents ruled out: Secondary | ICD-10-CM | POA: Diagnosis not present

## 2020-03-02 ENCOUNTER — Other Ambulatory Visit: Payer: Self-pay | Admitting: Physician Assistant

## 2020-03-02 DIAGNOSIS — J0100 Acute maxillary sinusitis, unspecified: Secondary | ICD-10-CM

## 2020-05-06 DIAGNOSIS — Z20822 Contact with and (suspected) exposure to covid-19: Secondary | ICD-10-CM | POA: Diagnosis not present

## 2020-05-06 DIAGNOSIS — Z03818 Encounter for observation for suspected exposure to other biological agents ruled out: Secondary | ICD-10-CM | POA: Diagnosis not present

## 2020-06-23 ENCOUNTER — Ambulatory Visit (INDEPENDENT_AMBULATORY_CARE_PROVIDER_SITE_OTHER): Payer: BC Managed Care – PPO | Admitting: Physician Assistant

## 2020-06-23 ENCOUNTER — Ambulatory Visit: Payer: BC Managed Care – PPO

## 2020-06-23 ENCOUNTER — Encounter: Payer: Self-pay | Admitting: Physician Assistant

## 2020-06-23 ENCOUNTER — Other Ambulatory Visit: Payer: Self-pay

## 2020-06-23 VITALS — BP 131/85 | HR 103 | Wt 231.9 lb

## 2020-06-23 DIAGNOSIS — Z6831 Body mass index (BMI) 31.0-31.9, adult: Secondary | ICD-10-CM

## 2020-06-23 DIAGNOSIS — E6609 Other obesity due to excess calories: Secondary | ICD-10-CM

## 2020-06-23 DIAGNOSIS — G8929 Other chronic pain: Secondary | ICD-10-CM | POA: Insufficient documentation

## 2020-06-23 DIAGNOSIS — M5441 Lumbago with sciatica, right side: Secondary | ICD-10-CM

## 2020-06-23 MED ORDER — SAXENDA 18 MG/3ML ~~LOC~~ SOPN: 3.0000 mg | PEN_INJECTOR | Freq: Every day | SUBCUTANEOUS | 1 refills | Status: DC

## 2020-06-23 NOTE — Progress Notes (Signed)
Subjective:    Patient ID: Jonathan Shaw, male    DOB: 06/24/1970, 50 y.o.   MRN: [id number]  HPI  Patient is a 50 year old male who presents to the clinic for medication refill and to discuss chronic low back pain.  Patient does remain on Saxenda.  He has experienced some weight gain over Christmas.  He plans to get back on track in the new year.  Does like his back pain is limiting his physical exercise which is causing some weight gain.  Patient has had low right-sided back pain that radiates into his right side for the last 20 years since a L4 injury years ago on the job.  It does shoot down his right leg into his great toe.  He is having more and more episodes of this pain.  It is happening at least 3 times a week and lasting for hours to an entire day.  Patient uses ibuprofen rest and lidocaine patches.  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  He would like to have this investigated.  He denies any bowel or bladder dysfunction or saddle anesthesia.  He denies any lower extremity leg weakness.   .. Active Ambulatory Problems    Diagnosis Date Noted  . Seasonal allergies 10/11/2014  . Class 1 obesity due to excess calories with serious comorbidity and body mass index (BMI) of 31.0 to 31.9 in adult 10/11/2014  . Abnormal weight gain 10/11/2014  . Hyperlipidemia 10/14/2014  . Dyslipidemia (high LDL; low HDL) 10/13/2015  . Constipation 11/21/2015  . Heel pain 11/21/2015  . Elevated fasting glucose 02/21/2016  . Non-recurrent unilateral inguinal hernia without obstruction or gangrene 07/28/2018  . Diverticulosis 07/28/2018  . Snoring 03/19/2019  . Microcytic anemia 06/25/2019  . Chronic right-sided low back pain with right-sided sciatica 06/23/2020   Resolved Ambulatory Problems    Diagnosis Date Noted  . Pharyngitis 05/27/2009  . Swollen uvula 03/19/2019   Past Medical History:  Diagnosis Date  . High cholesterol       Review of Systems  All other  systems reviewed and are negative.      Objective:   Physical Exam Vitals reviewed.  Constitutional:      Appearance: Normal appearance. He is obese.  Cardiovascular:     Rate and Rhythm: Normal rate and regular rhythm.     Pulses: Normal pulses.     Heart sounds: Normal heart sounds.  Pulmonary:     Effort: Pulmonary effort is normal.     Breath sounds: Normal breath sounds.  Musculoskeletal:     Right lower leg: Edema present.     Left lower leg: No edema.     Comments: No lumbar tenderness to palpation.  Positive right SLR with radiculopathy.  Bilateral lower extremity strength 5/5.  patallar reflexes 2+ and intact.   Neurological:     General: No focal deficit present.     Mental Status: He is alert and oriented to person, place, and time.  Psychiatric:        Mood and Affect: Mood normal.           Assessment & Plan:  .Jonathan Shaw was seen today for back pain.  Diagnoses and all orders for this visit:  Chronic right-sided low back pain with right-sided sciatica -     DG Lumbar Spine Complete -     CBC with Differential -     HgB A1c -     MR Lumbar  Spine Wo Contrast  Class 1 obesity due to excess calories with serious comorbidity and body mass index (BMI) of 31.0 to 31.9 in adult -     Liraglutide -Weight Management (SAXENDA) 18 MG/3ML SOPN; Inject 3 mg into the skin daily. -     CBC with Differential -     HgB A1c   Pt has not had an xray in a while. Ordered xray.  Due to positive SLR and ongoing issues. MrI ordered as well and to follow up with Dr. Darene Lamer for planning.  Continue NsAID, tens unit, exercises, icy hot patches, rest for treatment.   Jonathan Kitchen.Discussed low carb diet with 1500 calories and 80g of protein.  Exercising at least 150 minutes a week.  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  Continue saxenda.

## 2020-06-24 ENCOUNTER — Other Ambulatory Visit: Payer: Self-pay

## 2020-06-24 ENCOUNTER — Ambulatory Visit (INDEPENDENT_AMBULATORY_CARE_PROVIDER_SITE_OTHER): Payer: BC Managed Care – PPO

## 2020-06-24 DIAGNOSIS — M545 Low back pain, unspecified: Secondary | ICD-10-CM | POA: Diagnosis not present

## 2020-06-24 DIAGNOSIS — G8929 Other chronic pain: Secondary | ICD-10-CM | POA: Diagnosis not present

## 2020-06-24 LAB — HEMOGLOBIN A1C
Hgb A1c MFr Bld: 5.4 % of total Hgb (ref <5.7)
Mean Plasma Glucose: 108 mg/dL
eAG (mmol/L): 6 mmol/L

## 2020-06-24 LAB — CBC WITH DIFFERENTIAL/PLATELET
Absolute Monocytes: 787 cells/uL (ref 200–950)
Basophils Absolute: 62 cells/uL (ref 0–200)
Basophils Relative: 0.9 %
Eosinophils Absolute: 90 cells/uL (ref 15–500)
Eosinophils Relative: 1.3 %
HCT: 43.3 % (ref 38.5–50.0)
Hemoglobin: 15.2 g/dL (ref 13.2–17.1)
Lymphs Abs: 2056 cells/uL (ref 850–3900)
MCH: 31.5 pg (ref 27.0–33.0)
MCHC: 35.1 g/dL (ref 32.0–36.0)
MCV: 89.6 fL (ref 80.0–100.0)
MPV: 10.1 fL (ref 7.5–12.5)
Monocytes Relative: 11.4 %
Neutro Abs: 3905 cells/uL (ref 1500–7800)
Neutrophils Relative %: 56.6 %
Platelets: 316 10*3/uL (ref 140–400)
RBC: 4.83 10*6/uL (ref 4.20–5.80)
RDW: 12.8 % (ref 11.0–15.0)
Total Lymphocyte: 29.8 %
WBC: 6.9 10*3/uL (ref 3.8–10.8)

## 2020-06-25 NOTE — Progress Notes (Signed)
Your xray looks pretty good. Disc space is maintained. Will get MRI.

## 2020-06-25 NOTE — Progress Notes (Signed)
Hemoglobin looks great and sugars look great. Normal labs.

## 2020-06-26 ENCOUNTER — Encounter: Payer: Self-pay | Admitting: Physician Assistant

## 2020-07-01 ENCOUNTER — Other Ambulatory Visit: Payer: Self-pay

## 2020-07-01 ENCOUNTER — Ambulatory Visit (INDEPENDENT_AMBULATORY_CARE_PROVIDER_SITE_OTHER): Payer: BC Managed Care – PPO

## 2020-07-01 DIAGNOSIS — M5126 Other intervertebral disc displacement, lumbar region: Secondary | ICD-10-CM

## 2020-07-01 DIAGNOSIS — M545 Low back pain, unspecified: Secondary | ICD-10-CM | POA: Diagnosis not present

## 2020-07-03 ENCOUNTER — Encounter: Payer: Self-pay | Admitting: Physician Assistant

## 2020-07-03 DIAGNOSIS — M5136 Other intervertebral disc degeneration, lumbar region: Secondary | ICD-10-CM | POA: Insufficient documentation

## 2020-07-03 NOTE — Progress Notes (Signed)
Small disc central protrusion at L4 where expected. Make appt with Dr. Darene Lamer to view and discuss plan of action.

## 2020-07-05 NOTE — Progress Notes (Signed)
Patient scheduled for next Wed 07/12/20. AM

## 2020-07-12 ENCOUNTER — Other Ambulatory Visit: Payer: Self-pay

## 2020-07-12 ENCOUNTER — Ambulatory Visit (INDEPENDENT_AMBULATORY_CARE_PROVIDER_SITE_OTHER): Payer: BC Managed Care – PPO | Admitting: Sports Medicine

## 2020-07-12 DIAGNOSIS — M5136 Other intervertebral disc degeneration, lumbar region: Secondary | ICD-10-CM

## 2020-07-12 DIAGNOSIS — M51369 Other intervertebral disc degeneration, lumbar region without mention of lumbar back pain or lower extremity pain: Secondary | ICD-10-CM

## 2020-07-12 MED ORDER — CYCLOBENZAPRINE HCL 10 MG PO TABS: ORAL_TABLET | ORAL | 0 refills | Status: DC

## 2020-07-12 MED ORDER — LIDOCAINE 5 % EX PTCH: MEDICATED_PATCH | CUTANEOUS | 11 refills | Status: DC

## 2020-07-12 NOTE — Assessment & Plan Note (Signed)
This is a very pleasant 50 year old male SRO, he has a long history of low back pain, occasionally radiating down the right leg, worse with prolonged standing. He did see Luvenia Starch, he had an MRI done that shows L4-S1 DDD without overt neuroforaminal stenosis, we do need to remember that he is supine in the MRI. I explained the anatomy and evolution of lumbar DDD, we will start with aggressive formal physical therapy, he has noted good improvements with Lidoderm patches and cyclobenzaprine in the past so I am happy to refill this. Return to see me in about 6 weeks, if insufficient improvement we will discuss epidural injections. I would also like him to get some custom molded orthotics from Dr. Raeford Razor.

## 2020-07-12 NOTE — Progress Notes (Signed)
    Procedures performed today:    None.  Independent interpretation of notes and tests performed by another provider:   None.  Brief History, Exam, Impression, and Recommendations:    DDD (degenerative disc disease), lumbar This is a very pleasant 50 year old male SRO, he has a long history of low back pain, occasionally radiating down the right leg, worse with prolonged standing. He did see Luvenia Starch, he had an MRI done that shows L4-S1 DDD without overt neuroforaminal stenosis, we do need to remember that he is supine in the MRI. I explained the anatomy and evolution of lumbar DDD, we will start with aggressive formal physical therapy, he has noted good improvements with Lidoderm patches and cyclobenzaprine in the past so I am happy to refill this. Return to see me in about 6 weeks, if insufficient improvement we will discuss epidural injections. I would also like him to get some custom molded orthotics from Dr. Raeford Razor.    ___________________________________________ Gwen Her. Dianah Field, M.D., ABFM., CAQSM. Primary Care and [place] Instructor of [employer] [facility] [employer]

## 2020-07-13 ENCOUNTER — Telehealth: Payer: Self-pay

## 2020-07-13 NOTE — Telephone Encounter (Signed)
PA for Lidocaine 5% patch submitted through covermy meds; awaiting response.

## 2020-07-14 ENCOUNTER — Other Ambulatory Visit: Payer: Self-pay | Admitting: Sports Medicine

## 2020-07-14 ENCOUNTER — Telehealth: Payer: Self-pay

## 2020-07-14 DIAGNOSIS — M5136 Other intervertebral disc degeneration, lumbar region: Secondary | ICD-10-CM

## 2020-07-14 NOTE — Telephone Encounter (Signed)
Fax received from covermymeds that previous authorization was getting ready to expire. New authorization initiated; awaiting response.

## 2020-07-17 ENCOUNTER — Ambulatory Visit: Payer: BC Managed Care – PPO | Admitting: Rehabilitative and Restorative Service Providers"

## 2020-07-18 NOTE — Telephone Encounter (Signed)
Renewal was denied due to patient not maintaining a 4% weight loss. Provider aware, as well as, patient being aware.

## 2020-07-19 ENCOUNTER — Other Ambulatory Visit: Payer: Self-pay

## 2020-07-19 ENCOUNTER — Ambulatory Visit: Payer: BC Managed Care – PPO | Admitting: Family Medicine

## 2020-07-19 DIAGNOSIS — M5136 Other intervertebral disc degeneration, lumbar region: Secondary | ICD-10-CM | POA: Diagnosis not present

## 2020-07-19 NOTE — Progress Notes (Signed)
  Jonathan Shaw - 50 y.o. male MRN [id number]  Date of birth: [birth year]  SUBJECTIVE:  Including CC & ROS.  No chief complaint on file.   Jonathan Shaw is a 50 y.o. male that is presenting with acute on chronic lower back pain.  Stands on his feet for long periods of time.  Symptoms seem to worsen longer through the course of the day.   Review of Systems See HPI   HISTORY: Past Medical, Surgical, Social, and Family History Reviewed & Updated per EMR.   Pertinent Historical Findings include:  Past Medical History:  Diagnosis Date  . High cholesterol     Past Surgical History:  Procedure Laterality Date  . APPENDECTOMY    . COLONOSCOPY  09/06/2019  . UPPER GASTROINTESTINAL ENDOSCOPY  09/06/2019    Family History  Adopted: Yes  Problem Relation Age of Onset  . Other Mother   . Other Father   . Colon cancer Neg Hx   . Esophageal cancer Neg Hx   . Rectal cancer Neg Hx   . Stomach cancer Neg Hx     Social History   Socioeconomic History  . Marital status: Married    Spouse name: Not on file  . Number of children: Not on file  . Years of education: Not on file  . Highest education level: Not on file  Occupational History  . Not on file  Tobacco Use  . Smoking status: Never Smoker  . Smokeless tobacco: Never Used  Vaping Use  . Vaping Use: Never used  Substance and Sexual Activity  . Alcohol use: Yes  . Drug use: No  . Sexual activity: Not on file  Other Topics Concern  . Not on file  Social History Narrative  . Not on file   Social Determinants of Health   Financial Resource Strain: Not on file  Food Insecurity: Not on file  Transportation Needs: Not on file  Physical Activity: Not on file  Stress: Not on file  Social Connections: Not on file  Intimate Partner Violence: Not on file     PHYSICAL EXAM:  VS: BP 128/90 (BP Location: Left Arm, Patient Position: Sitting)   Ht 5\' 11"  (1.803 m)   Wt 225 lb (102.1 kg)   BMI 31.38 kg/m  Physical  Exam Gen: NAD, alert, cooperative with exam, well-appearing  Patient was fitted for a standard, cushioned, semi-rigid orthotic. The orthotic was heated and afterward the patient stood on the orthotic blank positioned on the orthotic stand. The patient was positioned in subtalar neutral position and 10 degrees of ankle dorsiflexion in a weight bearing stance. After completion of molding, a stable base was applied to the orthotic blank. The blank was ground to a stable position for weight bearing. Size: 14 Pairs: 2 Base: Blue EVA Additional Posting and Padding: None The patient ambulated these, and they were very comfortable.    ASSESSMENT & PLAN:   DDD (degenerative disc disease), lumbar Acute on chronic in nature.  Seems to be exacerbated with prolonged time that he has to spend on his feet. -Counseled on home exercise therapy and supportive care. -Orthotics.

## 2020-07-19 NOTE — Assessment & Plan Note (Signed)
Acute on chronic in nature.  Seems to be exacerbated with prolonged time that he has to spend on his feet. -Counseled on home exercise therapy and supportive care. -Orthotics.

## 2020-07-20 ENCOUNTER — Other Ambulatory Visit: Payer: Self-pay | Admitting: Physician Assistant

## 2020-07-20 ENCOUNTER — Telehealth: Payer: Self-pay

## 2020-07-20 DIAGNOSIS — M5136 Other intervertebral disc degeneration, lumbar region: Secondary | ICD-10-CM

## 2020-07-20 DIAGNOSIS — E785 Hyperlipidemia, unspecified: Secondary | ICD-10-CM

## 2020-07-20 DIAGNOSIS — M51369 Other intervertebral disc degeneration, lumbar region without mention of lumbar back pain or lower extremity pain: Secondary | ICD-10-CM

## 2020-07-20 NOTE — Telephone Encounter (Signed)
Fax received from [facility] that a PA was needed for patient's Lidocaine patches. PA submitted through covermymeds; DENIED.   Please advise.

## 2020-07-21 MED ORDER — LIDOCAINE 5 % EX PTCH
MEDICATED_PATCH | CUTANEOUS | 11 refills | Status: DC
Start: 2020-07-21 — End: 2021-01-22

## 2020-07-21 NOTE — Telephone Encounter (Signed)
Done

## 2020-07-21 NOTE — Telephone Encounter (Signed)
Patient aware that he can get the prescription at [facility] for a smaller out of pocket cost.  Please send prescription to [place]. Patient will use [facility].

## 2020-07-21 NOTE — Telephone Encounter (Signed)
He will just need to pay for this in cash.  Of note it is about $47 at [facility] with a good Rx coupon.

## 2020-07-31 ENCOUNTER — Other Ambulatory Visit: Payer: Self-pay | Admitting: Physician Assistant

## 2020-07-31 DIAGNOSIS — D649 Anemia, unspecified: Secondary | ICD-10-CM

## 2020-08-23 ENCOUNTER — Other Ambulatory Visit: Payer: Self-pay

## 2020-08-23 ENCOUNTER — Ambulatory Visit: Payer: BC Managed Care – PPO | Admitting: Sports Medicine

## 2020-08-23 DIAGNOSIS — M5441 Lumbago with sciatica, right side: Secondary | ICD-10-CM

## 2020-08-23 DIAGNOSIS — G8929 Other chronic pain: Secondary | ICD-10-CM

## 2020-08-23 MED ORDER — MELOXICAM 15 MG PO TABS: ORAL_TABLET | ORAL | 3 refills | Status: DC

## 2020-08-23 NOTE — Progress Notes (Signed)
    Procedures performed today:    None.  Independent interpretation of notes and tests performed by another provider:   None.  Brief History, Exam, Impression, and Recommendations:    Chronic right-sided low back pain with right-sided sciatica This pleasant 50 year old male SRO has multilevel lumbar DDD with right-sided L4 radicular symptoms. He has improved considerably, with Lidoderm patches, cyclobenzaprine, custom orthotics. Still has occasional right-sided L4 radiculitis but mild. Adding meloxicam, he can call me for refills on cyclobenzaprine and Lidoderm patches. Continue rehabilitation exercises on a daily basis, and return to see me as needed.    ___________________________________________ Gwen Her. Dianah Field, M.D., ABFM., CAQSM. Primary Care and [place] Instructor of [place] [facility] [employer]

## 2020-08-23 NOTE — Assessment & Plan Note (Addendum)
This pleasant 50 year old male SRO has multilevel lumbar DDD with right-sided L4 radicular symptoms. He has improved considerably, with Lidoderm patches, cyclobenzaprine, custom orthotics. Still has occasional right-sided L4 radiculitis but mild. Adding meloxicam, he can call me for refills on cyclobenzaprine and Lidoderm patches. Continue rehabilitation exercises on a daily basis, and return to see me as needed.

## 2020-10-19 ENCOUNTER — Other Ambulatory Visit: Payer: Self-pay | Admitting: Physician Assistant

## 2020-10-19 DIAGNOSIS — E785 Hyperlipidemia, unspecified: Secondary | ICD-10-CM

## 2020-11-03 DIAGNOSIS — Z20822 Contact with and (suspected) exposure to covid-19: Secondary | ICD-10-CM | POA: Diagnosis not present

## 2020-11-17 ENCOUNTER — Other Ambulatory Visit: Payer: Self-pay | Admitting: Physician Assistant

## 2020-11-17 DIAGNOSIS — E785 Hyperlipidemia, unspecified: Secondary | ICD-10-CM

## 2021-01-22 ENCOUNTER — Ambulatory Visit (INDEPENDENT_AMBULATORY_CARE_PROVIDER_SITE_OTHER): Payer: BC Managed Care – PPO | Admitting: Physician Assistant

## 2021-01-22 ENCOUNTER — Other Ambulatory Visit: Payer: Self-pay

## 2021-01-22 VITALS — BP 132/96 | HR 97 | Ht 71.0 in | Wt 237.0 lb

## 2021-01-22 DIAGNOSIS — J302 Other seasonal allergic rhinitis: Secondary | ICD-10-CM

## 2021-01-22 DIAGNOSIS — D649 Anemia, unspecified: Secondary | ICD-10-CM | POA: Diagnosis not present

## 2021-01-22 DIAGNOSIS — Z6833 Body mass index (BMI) 33.0-33.9, adult: Secondary | ICD-10-CM

## 2021-01-22 DIAGNOSIS — R1031 Right lower quadrant pain: Secondary | ICD-10-CM

## 2021-01-22 DIAGNOSIS — M51369 Other intervertebral disc degeneration, lumbar region without mention of lumbar back pain or lower extremity pain: Secondary | ICD-10-CM

## 2021-01-22 DIAGNOSIS — E6609 Other obesity due to excess calories: Secondary | ICD-10-CM | POA: Diagnosis not present

## 2021-01-22 DIAGNOSIS — R319 Hematuria, unspecified: Secondary | ICD-10-CM

## 2021-01-22 DIAGNOSIS — M5136 Other intervertebral disc degeneration, lumbar region: Secondary | ICD-10-CM

## 2021-01-22 DIAGNOSIS — E66811 Obesity, class 1: Secondary | ICD-10-CM

## 2021-01-22 DIAGNOSIS — R140 Abdominal distension (gaseous): Secondary | ICD-10-CM

## 2021-01-22 DIAGNOSIS — E785 Hyperlipidemia, unspecified: Secondary | ICD-10-CM | POA: Diagnosis not present

## 2021-01-22 LAB — LIPID PANEL W/REFLEX DIRECT LDL
Cholesterol: 248 mg/dL — ABNORMAL HIGH (ref <200)
HDL: 42 mg/dL (ref >=40)
LDL Cholesterol (Calc): 175 mg/dL (calc) — ABNORMAL HIGH
Non-HDL Cholesterol (Calc): 206 mg/dL (calc) — ABNORMAL HIGH (ref <130)
Total CHOL/HDL Ratio: 5.9 (calc) — ABNORMAL HIGH (ref <5.0)
Triglycerides: 161 mg/dL — ABNORMAL HIGH (ref <150)

## 2021-01-22 LAB — POCT URINALYSIS DIP (CLINITEK)
Bilirubin, UA: NEGATIVE
Glucose, UA: NEGATIVE mg/dL
Ketones, POC UA: NEGATIVE mg/dL
Leukocytes, UA: NEGATIVE
Nitrite, UA: NEGATIVE
POC PROTEIN,UA: NEGATIVE
Spec Grav, UA: >=1.03 — AB (ref 1.010–1.025)
Urobilinogen, UA: 0.2 E.U./dL
pH, UA: 5.5 (ref 5.0–8.0)

## 2021-01-22 MED ORDER — LIDOCAINE 5 % EX PTCH: MEDICATED_PATCH | CUTANEOUS | 11 refills | Status: AC

## 2021-01-22 MED ORDER — WEGOVY 0.25 MG/0.5ML ~~LOC~~ SOAJ: 0.2500 mg | SUBCUTANEOUS | 0 refills | Status: DC

## 2021-01-22 MED ORDER — CETIRIZINE HCL 5 MG PO TABS: 5.0000 mg | ORAL_TABLET | Freq: Three times a day (TID) | ORAL | 5 refills | Status: DC | PRN

## 2021-01-22 NOTE — Progress Notes (Signed)
Subjective:    Patient ID: Jonathan Shaw, male    DOB: 1970-08-16, 50 y.o.   MRN: [id number]  HPI Patient is a 50 year old obese male who presents to the clinic for follow-up and to discuss new intermittent pain for the last 3 months of his right lower quadrant.  Patient has noticed that he has pain just above his bellybutton to the left and right lower quadrant right over his appendectomy scar.  Pain is intermittent and for the last 3 months.  It is much worse when he pushes on the area.  He does not necessarily have ongoing pain if he is not pushing on his abdomen.  He denies any nausea, vomiting, increase in reflux.  He does have a history of Helicobacter pylori.  He denies any significant stool changes.  He denies any melena or hematochezia.  Does have a history of intermittent constipation.  He does feel like at times when he has a good bowel movement the pain is better.  He denies any pain with urination or urinary frequency, urgency symptoms.  He does feel more bloated, distended, full.   Pt does want to lose weight. He was on saxenda and insurance stopped paying for it. Interested in trying wegovy.   Pt request lidoderm patches to use as needed for low back pain. He finds they help a lot.   .. Active Ambulatory Problems    Diagnosis Date Noted   Seasonal allergies 10/11/2014   Class 1 obesity due to excess calories without serious comorbidity with body mass index (BMI) of 33.0 to 33.9 in adult 10/11/2014   Abnormal weight gain 10/11/2014   Hyperlipidemia 10/14/2014   Dyslipidemia (high LDL; low HDL) 10/13/2015   Constipation 11/21/2015   Heel pain 11/21/2015   Elevated fasting glucose 02/21/2016   Non-recurrent unilateral inguinal hernia without obstruction or gangrene 07/28/2018   Diverticulosis 07/28/2018   Snoring 03/19/2019   Microcytic anemia 06/25/2019   Chronic right-sided low back pain with right-sided sciatica 06/23/2020   DDD (degenerative disc disease), lumbar  07/03/2020   Right lower quadrant abdominal pain 01/23/2021   Abdominal distension (gaseous) 01/23/2021   Resolved Ambulatory Problems    Diagnosis Date Noted   Pharyngitis 05/27/2009   Swollen uvula 03/19/2019   Past Medical History:  Diagnosis Date   High cholesterol       Review of Systems See HPI.     Objective:   Physical Exam Vitals reviewed.  Constitutional:      Appearance: Normal appearance. He is obese.  HENT:     Head: Normocephalic.  Cardiovascular:     Rate and Rhythm: Normal rate and regular rhythm.     Pulses: Normal pulses.  Pulmonary:     Effort: Pulmonary effort is normal.     Breath sounds: Normal breath sounds.  Abdominal:     General: There is distension.     Palpations: There is no mass.     Tenderness: There is abdominal tenderness. There is guarding. There is no rebound.     Hernia: No hernia is present.       Comments: Hypoactive bowel sounds.   Musculoskeletal:     Right lower leg: No edema.     Left lower leg: No edema.  Neurological:     General: No focal deficit present.     Mental Status: He is alert and oriented to person, place, and time.  Psychiatric:        Mood and Affect: Mood normal.         .Marland Kitchen  Results for orders placed or performed in visit on 01/22/21  Urine Microscopic  Result Value Ref Range   WBC, UA NONE SEEN 0 - 5 /HPF   RBC / HPF 0-2 0 - 2 /HPF   Squamous Epithelial / LPF NONE SEEN < OR = 5 /HPF   Bacteria, UA NONE SEEN NONE SEEN /HPF   Calcium Oxalate Crystal FEW NONE OR FEW /HPF   Hyaline Cast NONE SEEN NONE SEEN /LPF   Note    POCT URINALYSIS DIP (CLINITEK)  Result Value Ref Range   Color, UA yellow yellow   Clarity, UA clear clear   Glucose, UA negative negative mg/dL   Bilirubin, UA negative negative   Ketones, POC UA negative negative mg/dL   Spec Grav, UA >=1.030 (A) 1.010 - 1.025   Blood, UA trace-intact (A) negative   pH, UA 5.5 5.0 - 8.0   POC PROTEIN,UA negative negative, trace    Urobilinogen, UA 0.2 0.2 or 1.0 E.U./dL   Nitrite, UA Negative Negative   Leukocytes, UA Negative Negative    Assessment & Plan:  .Marland KitchenBriceton was seen today for follow-up.  Diagnoses and all orders for this visit:  Right lower quadrant abdominal pain -     CT Abdomen Pelvis W Contrast; Future -     POCT URINALYSIS DIP (CLINITEK) -     Urine Culture -     Urine Microscopic  DDD (degenerative disc disease), lumbar -     lidocaine (LIDODERM) 5 %; Remove & Discard patch within 12 hours or as directed by MD  Abdominal distension (gaseous) -     CT Abdomen Pelvis W Contrast; Future  Class 1 obesity due to excess calories without serious comorbidity with body mass index (BMI) of 33.0 to 33.9 in adult -     WEGOVY 0.25 MG/0.5ML SOAJ; Inject 0.25 mg into the skin once a week. Use this dose for 1 month (4 shots) and then increase to next higher dose.  Seasonal allergies -     cetirizine (ZYRTEC) 5 MG tablet; Take 1 tablet (5 mg total) by mouth 3 (three) times daily as needed for allergies.  Trace blood in urine. Ordered culture and microscopic. With level of pain on exam. Would like CT of abdomen. No palpable hernia. R/o hernia vs adhesions vs bowel stricture vs abscess. CBC submitted to labs already drawn today. Denies any significant constipation. Consider stool softener.   Lidoderm patches rx. Continue with exercises and strengthing core.   Marland Kitchen.Discussed low carb diet with 1500 calories and 80g of protein.  Exercising at least 150 minutes a week.  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  Started [employer]. Discussed side effects and titration. Call if need 2nd month.

## 2021-01-23 ENCOUNTER — Encounter: Payer: Self-pay | Admitting: Physician Assistant

## 2021-01-23 ENCOUNTER — Other Ambulatory Visit: Payer: Self-pay | Admitting: Physician Assistant

## 2021-01-23 ENCOUNTER — Ambulatory Visit (INDEPENDENT_AMBULATORY_CARE_PROVIDER_SITE_OTHER): Payer: BC Managed Care – PPO

## 2021-01-23 DIAGNOSIS — R319 Hematuria, unspecified: Secondary | ICD-10-CM | POA: Insufficient documentation

## 2021-01-23 DIAGNOSIS — R1031 Right lower quadrant pain: Secondary | ICD-10-CM | POA: Insufficient documentation

## 2021-01-23 DIAGNOSIS — R140 Abdominal distension (gaseous): Secondary | ICD-10-CM | POA: Diagnosis not present

## 2021-01-23 DIAGNOSIS — R109 Unspecified abdominal pain: Secondary | ICD-10-CM | POA: Diagnosis not present

## 2021-01-23 DIAGNOSIS — J302 Other seasonal allergic rhinitis: Secondary | ICD-10-CM

## 2021-01-23 MED ORDER — IOHEXOL 300 MG/ML  SOLN
100.0000 mL | Freq: Once | INTRAMUSCULAR | Status: AC | PRN
  Administered 2021-01-23: 100 mL via INTRAVENOUS

## 2021-01-23 NOTE — Progress Notes (Signed)
Jeneen Rinks,   Your cholesterol has really shot up. Are you taking lipitor?

## 2021-01-23 NOTE — Progress Notes (Signed)
Jeneen Rinks,   Are you taking any oral iron? Your iron and iron stores are low.  Confirm no bright red or black tarry stools and no upper GI pain?   Can we add a CBC and A1C off this?

## 2021-01-23 NOTE — Progress Notes (Signed)
Your did have blood detected in UA in office. Microscopic does not show any RBC, WBC, bacteria. You do have some calcium oxalate crystals that can pre-cede kidney stones.

## 2021-01-24 LAB — URINALYSIS, MICROSCOPIC ONLY
Bacteria, UA: NONE SEEN /HPF
Hyaline Cast: NONE SEEN /LPF
Squamous Epithelial / HPF: NONE SEEN /HPF (ref <=5)
WBC, UA: NONE SEEN /HPF (ref 0–5)

## 2021-01-24 LAB — HEMOGLOBIN A1C

## 2021-01-24 LAB — URINE CULTURE
MICRO NUMBER:: 12217314
Result:: NO GROWTH
SPECIMEN QUALITY:: ADEQUATE

## 2021-01-24 LAB — IRON,TIBC AND FERRITIN PANEL
%SAT: 10 % (calc) — ABNORMAL LOW (ref 20–48)
Ferritin: 8 ng/mL — ABNORMAL LOW (ref 38–380)
Iron: 40 ug/dL — ABNORMAL LOW (ref 50–180)
TIBC: 402 mcg/dL (calc) (ref 250–425)

## 2021-01-24 LAB — CBC WITH DIFFERENTIAL/PLATELET

## 2021-01-24 MED ORDER — ATORVASTATIN CALCIUM 80 MG PO TABS: 80.0000 mg | ORAL_TABLET | Freq: Every day | ORAL | 3 refills | Status: DC

## 2021-01-24 NOTE — Progress Notes (Signed)
Increased lipitor to '80mg'$ .

## 2021-01-24 NOTE — Progress Notes (Signed)
Start ferrous sulfate '325mg'$  daily with breakfast.

## 2021-01-24 NOTE — Addendum Note (Signed)
Addended byDonella Stade on: 01/24/2021 03:30 PM   Modules accepted: Orders

## 2021-01-24 NOTE — Progress Notes (Signed)
No acute findings.  Fatty liver-working on weight loss and limiting alcohol intake can help with this.  No abdominal wall hernias noted.  You do have a sliding hiatal hernia but that is not the cause of any pain and very small. You do have diverticulosis(pockets) but no inflammation or infection noted.  You do have pockets of edema in the subcutaneous fat. Theory could be due to your weight loss injections. You could start giving them in lateral thigh.   At this point I say we work to get your bowels moving as nicely as we can to see if stool could be pushing up at these points causing pain.

## 2021-01-24 NOTE — Progress Notes (Signed)
No growth on urine culture.

## 2021-01-30 ENCOUNTER — Telehealth: Payer: Self-pay | Admitting: Physician Assistant

## 2021-01-30 ENCOUNTER — Encounter: Payer: Self-pay | Admitting: Neurology

## 2021-01-30 DIAGNOSIS — R7301 Impaired fasting glucose: Secondary | ICD-10-CM

## 2021-01-30 DIAGNOSIS — D649 Anemia, unspecified: Secondary | ICD-10-CM

## 2021-01-30 NOTE — Telephone Encounter (Signed)
This encounter was created in error - please disregard.

## 2021-01-30 NOTE — Telephone Encounter (Signed)
Patient made aware A1C (also CBC) wasn't able to be added to labs. A1C needed for work forms. Ordered and patient will have done.

## 2021-01-30 NOTE — Telephone Encounter (Signed)
Pt dropped off his Biometric Form to be filled out where he had labs drawn last week. I gave patient form to [place] and requested to call patient when ready for pick up. Thank you

## 2021-02-09 DIAGNOSIS — D649 Anemia, unspecified: Secondary | ICD-10-CM | POA: Diagnosis not present

## 2021-02-09 DIAGNOSIS — R7301 Impaired fasting glucose: Secondary | ICD-10-CM | POA: Diagnosis not present

## 2021-02-10 ENCOUNTER — Other Ambulatory Visit: Payer: Self-pay | Admitting: Sports Medicine

## 2021-02-10 DIAGNOSIS — M5136 Other intervertebral disc degeneration, lumbar region: Secondary | ICD-10-CM

## 2021-02-10 LAB — CBC WITH DIFFERENTIAL/PLATELET
Absolute Monocytes: 568 cells/uL (ref 200–950)
Basophils Absolute: 52 cells/uL (ref 0–200)
Basophils Relative: 0.9 %
Eosinophils Absolute: 81 cells/uL (ref 15–500)
Eosinophils Relative: 1.4 %
HCT: 41.6 % (ref 38.5–50.0)
Hemoglobin: 14.1 g/dL (ref 13.2–17.1)
Lymphs Abs: 1931 cells/uL (ref 850–3900)
MCH: 29.2 pg (ref 27.0–33.0)
MCHC: 33.9 g/dL (ref 32.0–36.0)
MCV: 86.1 fL (ref 80.0–100.0)
MPV: 9.9 fL (ref 7.5–12.5)
Monocytes Relative: 9.8 %
Neutro Abs: 3167 cells/uL (ref 1500–7800)
Neutrophils Relative %: 54.6 %
Platelets: 363 10*3/uL (ref 140–400)
RBC: 4.83 10*6/uL (ref 4.20–5.80)
RDW: 13.9 % (ref 11.0–15.0)
Total Lymphocyte: 33.3 %
WBC: 5.8 10*3/uL (ref 3.8–10.8)

## 2021-02-10 LAB — HEMOGLOBIN A1C
Hgb A1c MFr Bld: 5.4 % of total Hgb (ref <5.7)
Mean Plasma Glucose: 108 mg/dL
eAG (mmol/L): 6 mmol/L

## 2021-02-12 NOTE — Progress Notes (Signed)
A1C normal range. CBC looks good.

## 2021-03-05 ENCOUNTER — Telehealth: Payer: Self-pay

## 2021-03-05 NOTE — Telephone Encounter (Signed)
Medication: lidocaine (LIDODERM) 5 % patches Prior authorization submitted via CoverMyMeds on 9/19//2022 PA submission pending

## 2021-03-08 ENCOUNTER — Telehealth: Payer: Self-pay

## 2021-03-08 NOTE — Telephone Encounter (Signed)
Medication: WEGOVY 0.25 MG/0.5ML SOAJ Prior authorization submitted via CoverMyMeds on 03/08/2021 PA submission pending

## 2021-03-13 ENCOUNTER — Other Ambulatory Visit: Payer: Self-pay | Admitting: Neurology

## 2021-03-13 MED ORDER — WEGOVY 1.7 MG/0.75ML ~~LOC~~ SOAJ: 1.7000 mg | SUBCUTANEOUS | 0 refills | Status: DC

## 2021-03-13 NOTE — Telephone Encounter (Signed)
Patient's wife called stating pharmacy only has Wegovy 1.7 mg  she is aware this would start them out at a higher dose and has a high potential to cause nausea/vomiting. She states she discussed with London Tarnowski at her last visit. Please advise.

## 2021-03-13 NOTE — Telephone Encounter (Signed)
Medication:  lidocaine (LIDODERM) 5 % patches Prior authorization determination received Medication has been denied Reason for denial: "This medication is approved for the treatment of lasting nerve and skin pain caused by shingles. In this case, the member is not being treated for pain from shingles."

## 2021-03-23 ENCOUNTER — Other Ambulatory Visit: Payer: Self-pay | Admitting: Sports Medicine

## 2021-03-23 DIAGNOSIS — M51369 Other intervertebral disc degeneration, lumbar region without mention of lumbar back pain or lower extremity pain: Secondary | ICD-10-CM

## 2021-03-23 DIAGNOSIS — M5136 Other intervertebral disc degeneration, lumbar region: Secondary | ICD-10-CM

## 2021-03-29 NOTE — Telephone Encounter (Signed)
Medication: WEGOVY 0.25 MG/0.5ML SOAJ Prior authorization determination received Medication has been approved Approval dates: 03/08/2021-07/11/2021  Patient aware via: phone  Pharmacy aware: Yes Provider aware via this encounter

## 2021-04-20 ENCOUNTER — Telehealth: Payer: Self-pay | Admitting: Neurology

## 2021-04-20 MED ORDER — TIRZEPATIDE 2.5 MG/0.5ML ~~LOC~~ SOAJ: 2.5000 mg | SUBCUTANEOUS | 0 refills | Status: DC

## 2021-04-20 NOTE — Telephone Encounter (Signed)
Patient's wife called, states that [facility] has been out of stock since he was prescribed it, that they only have 2.5 mg and don't know if they will get any other doses in.   You placed Melissa (wife) on [place] with coupon. Can you send that in for patient as well? Thanks!

## 2021-04-20 NOTE — Telephone Encounter (Signed)
Sent!

## 2021-05-31 ENCOUNTER — Other Ambulatory Visit: Payer: Self-pay | Admitting: Physician Assistant

## 2021-05-31 NOTE — Telephone Encounter (Signed)
Should this be titrated?

## 2021-06-06 ENCOUNTER — Other Ambulatory Visit: Payer: Self-pay | Admitting: Sports Medicine

## 2021-06-06 ENCOUNTER — Other Ambulatory Visit: Payer: Self-pay | Admitting: Physician Assistant

## 2021-06-06 DIAGNOSIS — E785 Hyperlipidemia, unspecified: Secondary | ICD-10-CM

## 2021-06-06 DIAGNOSIS — M5136 Other intervertebral disc degeneration, lumbar region: Secondary | ICD-10-CM

## 2021-06-28 ENCOUNTER — Other Ambulatory Visit: Payer: Self-pay | Admitting: Physician Assistant

## 2021-06-28 DIAGNOSIS — E785 Hyperlipidemia, unspecified: Secondary | ICD-10-CM

## 2021-06-28 NOTE — Telephone Encounter (Signed)
PA

## 2021-07-03 ENCOUNTER — Encounter: Payer: Self-pay | Admitting: Physician Assistant

## 2021-07-03 ENCOUNTER — Ambulatory Visit (INDEPENDENT_AMBULATORY_CARE_PROVIDER_SITE_OTHER): Payer: BC Managed Care – PPO | Admitting: Physician Assistant

## 2021-07-03 ENCOUNTER — Other Ambulatory Visit: Payer: Self-pay

## 2021-07-03 VITALS — BP 116/85 | HR 108 | Ht 71.0 in | Wt 227.0 lb

## 2021-07-03 DIAGNOSIS — M5136 Other intervertebral disc degeneration, lumbar region: Secondary | ICD-10-CM | POA: Diagnosis not present

## 2021-07-03 DIAGNOSIS — Z79899 Other long term (current) drug therapy: Secondary | ICD-10-CM | POA: Diagnosis not present

## 2021-07-03 DIAGNOSIS — E785 Hyperlipidemia, unspecified: Secondary | ICD-10-CM | POA: Diagnosis not present

## 2021-07-03 DIAGNOSIS — E6609 Other obesity due to excess calories: Secondary | ICD-10-CM

## 2021-07-03 DIAGNOSIS — Z6833 Body mass index (BMI) 33.0-33.9, adult: Secondary | ICD-10-CM

## 2021-07-03 LAB — LIPID PANEL W/REFLEX DIRECT LDL
Cholesterol: 142 mg/dL (ref <200)
HDL: 46 mg/dL (ref >=40)
LDL Cholesterol (Calc): 67 mg/dL (calc)
Non-HDL Cholesterol (Calc): 96 mg/dL (calc) (ref <130)
Total CHOL/HDL Ratio: 3.1 (calc) (ref <5.0)
Triglycerides: 233 mg/dL — ABNORMAL HIGH (ref <150)

## 2021-07-03 LAB — COMPLETE METABOLIC PANEL WITH GFR
AG Ratio: 2 (calc) (ref 1.0–2.5)
ALT: 53 U/L — ABNORMAL HIGH (ref 9–46)
AST: 28 U/L (ref 10–40)
Albumin: 5 g/dL (ref 3.6–5.1)
Alkaline phosphatase (APISO): 96 U/L (ref 36–130)
BUN: 16 mg/dL (ref 7–25)
CO2: 30 mmol/L (ref 20–32)
Calcium: 10.6 mg/dL — ABNORMAL HIGH (ref 8.6–10.3)
Chloride: 103 mmol/L (ref 98–110)
Creat: 0.87 mg/dL (ref 0.60–1.29)
Globulin: 2.5 g/dL (calc) (ref 1.9–3.7)
Glucose, Bld: 99 mg/dL (ref 65–99)
Potassium: 4.8 mmol/L (ref 3.5–5.3)
Sodium: 139 mmol/L (ref 135–146)
Total Bilirubin: 0.7 mg/dL (ref 0.2–1.2)
Total Protein: 7.5 g/dL (ref 6.1–8.1)
eGFR: 106 mL/min/{1.73_m2} (ref >=60)

## 2021-07-03 MED ORDER — TIRZEPATIDE 7.5 MG/0.5ML ~~LOC~~ SOAJ: 7.5000 mg | SUBCUTANEOUS | 0 refills | Status: DC

## 2021-07-03 MED ORDER — CYCLOBENZAPRINE HCL 10 MG PO TABS: ORAL_TABLET | ORAL | 3 refills | Status: DC

## 2021-07-03 MED ORDER — ATORVASTATIN CALCIUM 80 MG PO TABS: 80.0000 mg | ORAL_TABLET | Freq: Every day | ORAL | 3 refills | Status: DC

## 2021-07-03 NOTE — Progress Notes (Signed)
° °  Subjective:    Patient ID: Jonathan Shaw, male    DOB: 08-07-1970, 51 y.o.   MRN: [id number]  HPI Pt is a 51 yo male who presents to the clinic to follow up on weight.   He is on mounjaro and doing great. He is having trouble getting the 5mg  dose. He is staying active. No problems or concerns. Down 10lbs since august.   He uses flexeril as needed for back pain and muscle tightness. Needs refills.   .. Active Ambulatory Problems    Diagnosis Date Noted   Seasonal allergies 10/11/2014   Class 1 obesity due to excess calories without serious comorbidity with body mass index (BMI) of 33.0 to 33.9 in adult 10/11/2014   Abnormal weight gain 10/11/2014   Hyperlipidemia 10/14/2014   Dyslipidemia (high LDL; low HDL) 10/13/2015   Constipation 11/21/2015   Heel pain 11/21/2015   Elevated fasting glucose 02/21/2016   Non-recurrent unilateral inguinal hernia without obstruction or gangrene 07/28/2018   Diverticulosis 07/28/2018   Snoring 03/19/2019   Microcytic anemia 06/25/2019   Chronic right-sided low back pain with right-sided sciatica 06/23/2020   DDD (degenerative disc disease), lumbar 07/03/2020   Right lower quadrant abdominal pain 01/23/2021   Abdominal bloating 01/23/2021   Hematuria 01/23/2021   Resolved Ambulatory Problems    Diagnosis Date Noted   Pharyngitis 05/27/2009   Swollen uvula 03/19/2019   Past Medical History:  Diagnosis Date   High cholesterol        Review of Systems    See HPI.  Objective:   Physical Exam Vitals reviewed.  Constitutional:      Appearance: Normal appearance. He is obese.  HENT:     Head: Normocephalic.  Cardiovascular:     Rate and Rhythm: Normal rate and regular rhythm.     Pulses: Normal pulses.     Heart sounds: Normal heart sounds.  Pulmonary:     Effort: Pulmonary effort is normal.     Breath sounds: Normal breath sounds.  Neurological:     General: No focal deficit present.     Mental Status: He is alert and oriented  to person, place, and time.  Psychiatric:        Mood and Affect: Mood normal.          Assessment & Plan:  .Marland KitchenThadius was seen today for annual exam.  Diagnoses and all orders for this visit:  Dyslipidemia (high LDL; low HDL) -     atorvastatin (LIPITOR) 80 MG tablet; Take 1 tablet (80 mg total) by mouth daily. -     Lipid Panel w/reflex Direct LDL  DDD (degenerative disc disease), lumbar -     cyclobenzaprine (FLEXERIL) 10 MG tablet; 1 TABLET THREE TIMES A DAY  Medication management -     COMPLETE METABOLIC PANEL WITH GFR  Class 1 obesity due to excess calories without serious comorbidity with body mass index (BMI) of 33.0 to 33.9 in adult -     tirzepatide (MOUNJARO) 7.5 MG/0.5ML Pen; Inject 7.5 mg into the skin once a week.   Down 10lbs. Refilled next dose of mounjaro 7.5mg  with refills.   On lipitor recheck lipid level.   Refilled flexeril for ongoing back pain.

## 2021-07-04 ENCOUNTER — Other Ambulatory Visit: Payer: Self-pay

## 2021-07-04 DIAGNOSIS — R748 Abnormal levels of other serum enzymes: Secondary | ICD-10-CM

## 2021-07-04 NOTE — Progress Notes (Signed)
I would start fish oil. I am not to concerned about calcium. Likely fluke we can recheck in 3-6 months.

## 2021-07-04 NOTE — Progress Notes (Signed)
Jonathan Shaw,   LDL looks amazing! HDL is great.  TG are still elevated. Watch sugars/carbs/processed foods. Are you taking fish oil?  Kidney looks great.  Calcium up just a tad and ALT. Are you taking any extra calcium? Avoid alcohol and tylenol products and recheck liver enzymes in 2 weeks.

## 2021-07-12 ENCOUNTER — Telehealth: Payer: Self-pay

## 2021-07-12 NOTE — Telephone Encounter (Signed)
Medication: tirzepatide Darcel Bayley) 7.5 MG/0.5ML Pen Prior authorization submitted via CoverMyMeds on 07/12/2021 [place] submission pending

## 2021-07-25 ENCOUNTER — Other Ambulatory Visit: Payer: Self-pay | Admitting: Physician Assistant

## 2021-07-25 ENCOUNTER — Telehealth: Payer: Self-pay | Admitting: Neurology

## 2021-07-25 DIAGNOSIS — E6609 Other obesity due to excess calories: Secondary | ICD-10-CM

## 2021-07-25 MED ORDER — TIRZEPATIDE 7.5 MG/0.5ML ~~LOC~~ SOAJ: 7.5000 mg | SUBCUTANEOUS | 0 refills | Status: DC

## 2021-07-25 NOTE — Telephone Encounter (Signed)
Patient called and states the pharmacy doesn't have his Jonathan Shaw RX he dropped to them (it was printed) he asked to have [facility] [address]. RX sent.   Also reminded him that he need to have Hepatic panel rechecked.

## 2021-07-26 NOTE — Telephone Encounter (Signed)
PA

## 2021-08-10 ENCOUNTER — Telehealth: Payer: Self-pay

## 2021-08-10 NOTE — Telephone Encounter (Signed)
Initiated Prior authorization [id number] [facility]) 7.5 MG/0.5ML Pen Via: Covermymeds Case/Key: Status: Denied as of 08/10/21 Reason:previously denied by submission of another user, called bcbs because my submission of pt's prior auth was cancelled twice.[facility] [place] states the original reason for the denial was due to pt not meeting criteria,  for an approval ,Delene Ruffini is approved on basis of  when the  pt is dx with type 2 diabetes and there was no documentation sent in with  that dx. And therefore he was denied, I requested for  a consideration to be submitted on my end  and is under review of pharmacist currently. Notified Pt via: pt does not have my chart called left vm.

## 2021-08-14 NOTE — Telephone Encounter (Signed)
It was for weight loss not diabetes. Ok to let patient know.

## 2021-08-15 MED ORDER — WEGOVY 0.25 MG/0.5ML ~~LOC~~ SOAJ: 0.2500 mg | SUBCUTANEOUS | 0 refills | Status: DC

## 2021-08-15 NOTE — Addendum Note (Signed)
Addended by: Donella Stade on: 08/15/2021 12:32 PM ? ? Modules accepted: Orders ? ?

## 2021-09-05 ENCOUNTER — Telehealth: Payer: Self-pay | Admitting: Neurology

## 2021-09-05 MED ORDER — SAXENDA 18 MG/3ML ~~LOC~~ SOPN: 3.0000 mg | PEN_INJECTOR | Freq: Every day | SUBCUTANEOUS | 0 refills | Status: DC

## 2021-09-05 NOTE — Telephone Encounter (Signed)
RX faxed to pharmacy.

## 2021-09-05 NOTE — Telephone Encounter (Signed)
Patient's wife messaged in her chart stating patient would like to try [place] since [place] denied. Please advise.  ?

## 2021-09-06 ENCOUNTER — Telehealth: Payer: Self-pay

## 2021-09-06 NOTE — Telephone Encounter (Addendum)
Initiated Prior authorization [id number] '18MG'$ /3ML pen-injectors ?Via: Covermymeds ?Case/Key:  [id number] ?Status: denied as of 09/06/21 ?Reason: ?Notified Pt via: Pt does not have Mychart ?

## 2021-09-19 ENCOUNTER — Telehealth: Payer: Self-pay

## 2021-09-19 MED ORDER — WEGOVY 0.5 MG/0.5ML ~~LOC~~ SOAJ: 0.5000 mg | SUBCUTANEOUS | 0 refills | Status: DC

## 2021-09-19 MED ORDER — WEGOVY 1 MG/0.5ML ~~LOC~~ SOAJ: 1.0000 mg | SUBCUTANEOUS | 0 refills | Status: DC

## 2021-09-19 MED ORDER — WEGOVY 0.25 MG/0.5ML ~~LOC~~ SOAJ: 0.2500 mg | SUBCUTANEOUS | 0 refills | Status: DC

## 2021-09-19 NOTE — Telephone Encounter (Addendum)
Courtesy review  Prior authorization for: Wegovy 0.25/0.5 ML [place] SoaJ ?Via: Covermymeds ?Case/Key: [id number] ?Status: approved as of 09/19/21 ?Reason:effective 09/13/21-01-16-22, The request for an additional review  of denied services has been approved, claim services  Provided within the limits  of this approval will be processes based on the terms of the  members benefit  coverage  and status  of the coverage  at the time  services were received . Services provided  outside  the approved  referral limits will be the members responsibility  ?Please keep in mind this is not a guarantee of payment Eligibility and benefits determines will be made at the time the claim is reviewed for processing. ?Notified Pt via: Pt does not have Mychart, Spoke to pt's wife to notify approval. ? ?3 months sent to pharmacy.  ?

## 2021-10-13 ENCOUNTER — Other Ambulatory Visit: Payer: Self-pay | Admitting: Physician Assistant

## 2021-10-19 ENCOUNTER — Ambulatory Visit (INDEPENDENT_AMBULATORY_CARE_PROVIDER_SITE_OTHER): Payer: Self-pay

## 2021-10-19 ENCOUNTER — Other Ambulatory Visit: Payer: Self-pay | Admitting: Nurse Practitioner

## 2021-10-19 DIAGNOSIS — W19XXXA Unspecified fall, initial encounter: Secondary | ICD-10-CM

## 2021-10-19 DIAGNOSIS — M25522 Pain in left elbow: Secondary | ICD-10-CM

## 2021-10-19 DIAGNOSIS — R52 Pain, unspecified: Secondary | ICD-10-CM

## 2021-10-22 ENCOUNTER — Telehealth: Payer: Self-pay

## 2021-10-22 NOTE — Telephone Encounter (Signed)
.  $'5mg'p$  and '1mg'$  were post dated and sent 5/5 and 6/5

## 2021-10-22 NOTE — Telephone Encounter (Signed)
[name] wife sent a MyChart message requesting next doses of the [place].  ?

## 2021-10-25 NOTE — Telephone Encounter (Signed)
Patient left vm stating [facility] ? ?Called and LVM letting patient know prescriptions were already sent in but he needs to speak to a person at the pharmacy to get them filled. To call back with questions.  ?

## 2021-11-15 ENCOUNTER — Other Ambulatory Visit: Payer: Self-pay | Admitting: Physician Assistant

## 2021-11-27 ENCOUNTER — Other Ambulatory Visit: Payer: Self-pay | Admitting: Physician Assistant

## 2021-11-27 DIAGNOSIS — M5136 Other intervertebral disc degeneration, lumbar region: Secondary | ICD-10-CM

## 2021-12-19 ENCOUNTER — Other Ambulatory Visit: Payer: Self-pay | Admitting: Physician Assistant

## 2021-12-31 ENCOUNTER — Encounter: Payer: Self-pay | Admitting: Physician Assistant

## 2021-12-31 ENCOUNTER — Ambulatory Visit (INDEPENDENT_AMBULATORY_CARE_PROVIDER_SITE_OTHER): Payer: BC Managed Care – PPO | Admitting: Physician Assistant

## 2021-12-31 VITALS — BP 117/98 | HR 94 | Ht 71.0 in | Wt 199.0 lb

## 2021-12-31 DIAGNOSIS — E663 Overweight: Secondary | ICD-10-CM

## 2021-12-31 MED ORDER — WEGOVY 2.4 MG/0.75ML ~~LOC~~ SOAJ: 2.4000 mg | SUBCUTANEOUS | 1 refills | Status: DC

## 2021-12-31 NOTE — Progress Notes (Signed)
   Established Patient Office Visit  Subjective   Patient ID: Jonathan Shaw, male    DOB: [birth year]  Age: 50 y.o. MRN: [id number]  Chief Complaint  Patient presents with   Follow-up    HPI Pt is a 51 yo male who presents to the clinic to follow up on weight loss.   Pt is on wegovy 2.'4mg'$  and doing great. He has lost 28lbs since last visit. 38lbs since last August. He does have some intermittent constipation but no other side effects. His night time eating has stopped. He is more active. He feels great.   Review of Systems  All other systems reviewed and are negative.     Objective:     BP (!) 117/98   Pulse 94   Ht '5\' 11"'$  (1.803 m)   Wt 199 lb (90.3 kg)   SpO2 97%   BMI 27.75 kg/m  BP Readings from Last 3 Encounters:  12/31/21 (!) 117/98  07/03/21 116/85  01/22/21 (!) 132/96   Wt Readings from Last 3 Encounters:  12/31/21 199 lb (90.3 kg)  07/03/21 227 lb (103 kg)  01/22/21 237 lb (107.5 kg)      Physical Exam Constitutional:      Appearance: Normal appearance.  HENT:     Head: Normocephalic.  Cardiovascular:     Rate and Rhythm: Normal rate and regular rhythm.     Pulses: Normal pulses.     Heart sounds: Normal heart sounds.  Pulmonary:     Effort: Pulmonary effort is normal.  Neurological:     General: No focal deficit present.     Mental Status: He is alert and oriented to person, place, and time.  Psychiatric:        Mood and Affect: Mood normal.       Assessment & Plan:  .Marland KitchenKhaden was seen today for follow-up.  Diagnoses and all orders for this visit:  Overweight (BMI 25.0-29.9) -     Semaglutide-Weight Management (WEGOVY) 2.4 MG/0.75ML SOAJ; Inject 2.4 mg into the skin once a week.   Refilled wegovy for 6 months. Discussed ways to combat constipation. Continue to work on [employer] and exercise Great job on weight loss  Return in about 6 months (around 07/03/2022).    Iran Planas, PA-C

## 2022-01-19 ENCOUNTER — Other Ambulatory Visit: Payer: Self-pay | Admitting: Physician Assistant

## 2022-01-19 DIAGNOSIS — J302 Other seasonal allergic rhinitis: Secondary | ICD-10-CM

## 2022-01-19 IMAGING — DX DG LUMBAR SPINE COMPLETE 4+V
5 series · 5 of 5 positions shown · non-contrast
Comparison: None.

CLINICAL DATA: Low back pain for 20 years.

EXAM:
LUMBAR SPINE - COMPLETE 4+ VIEW

[l-spine ap]
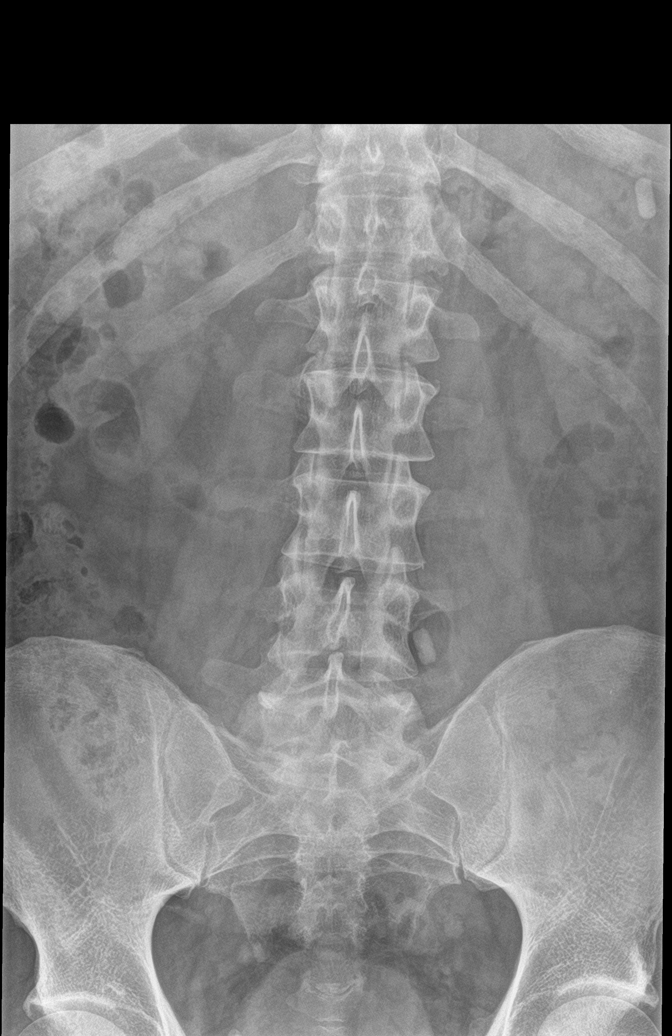

[l-spine obl (1 of 2)]
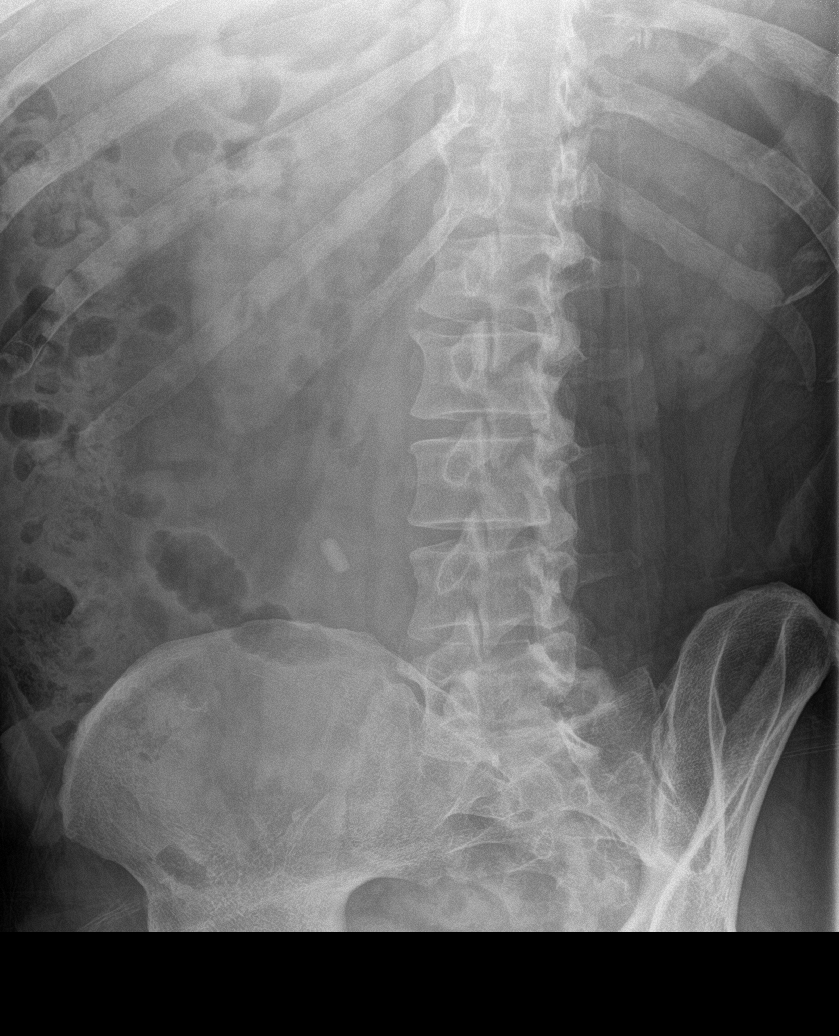

[l-spine obl (2 of 2)]
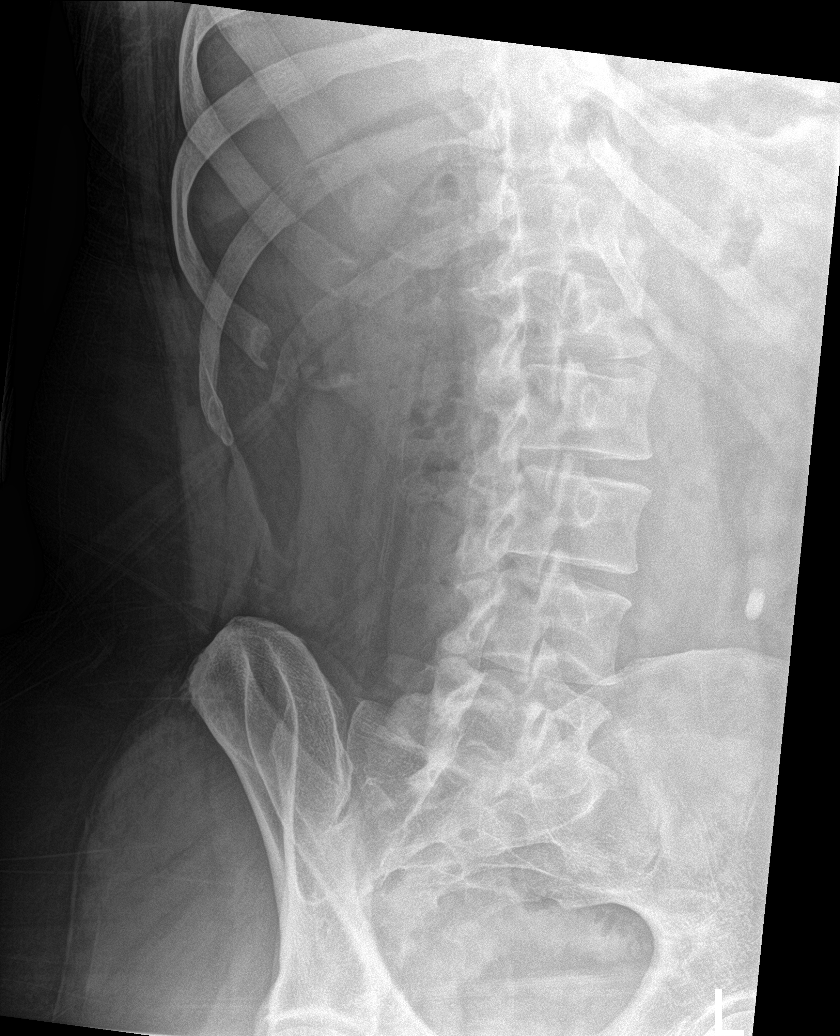

[l-spine lat]
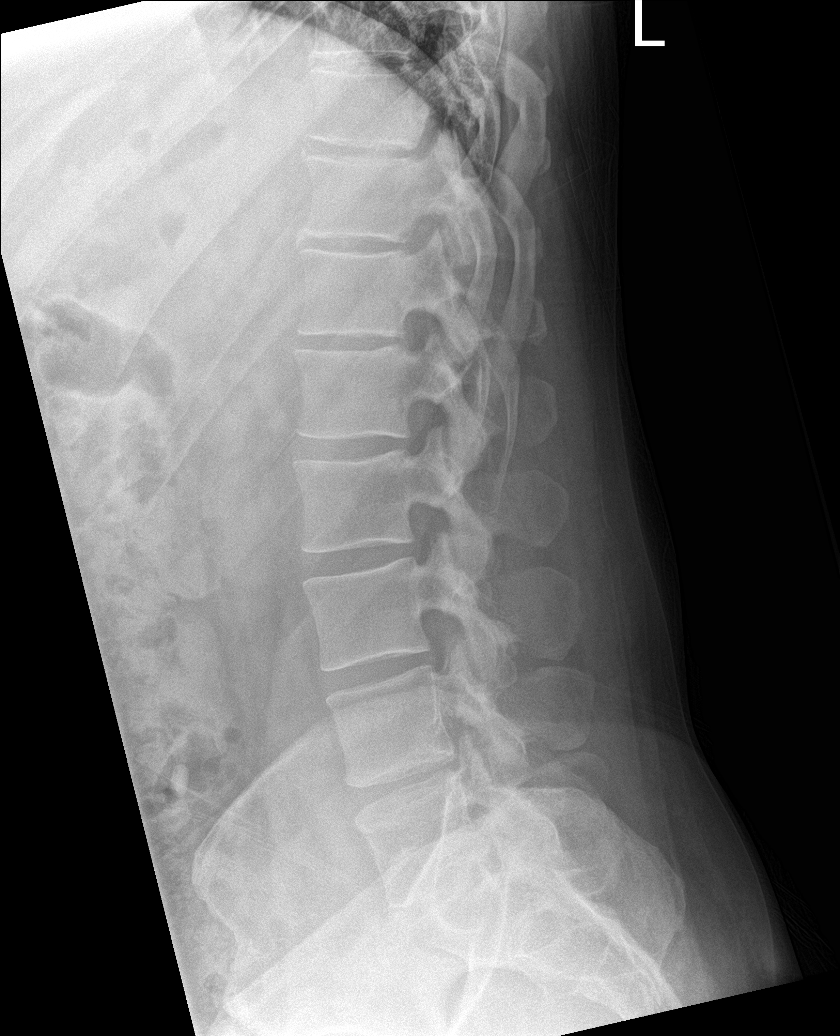

[l-spine spot]
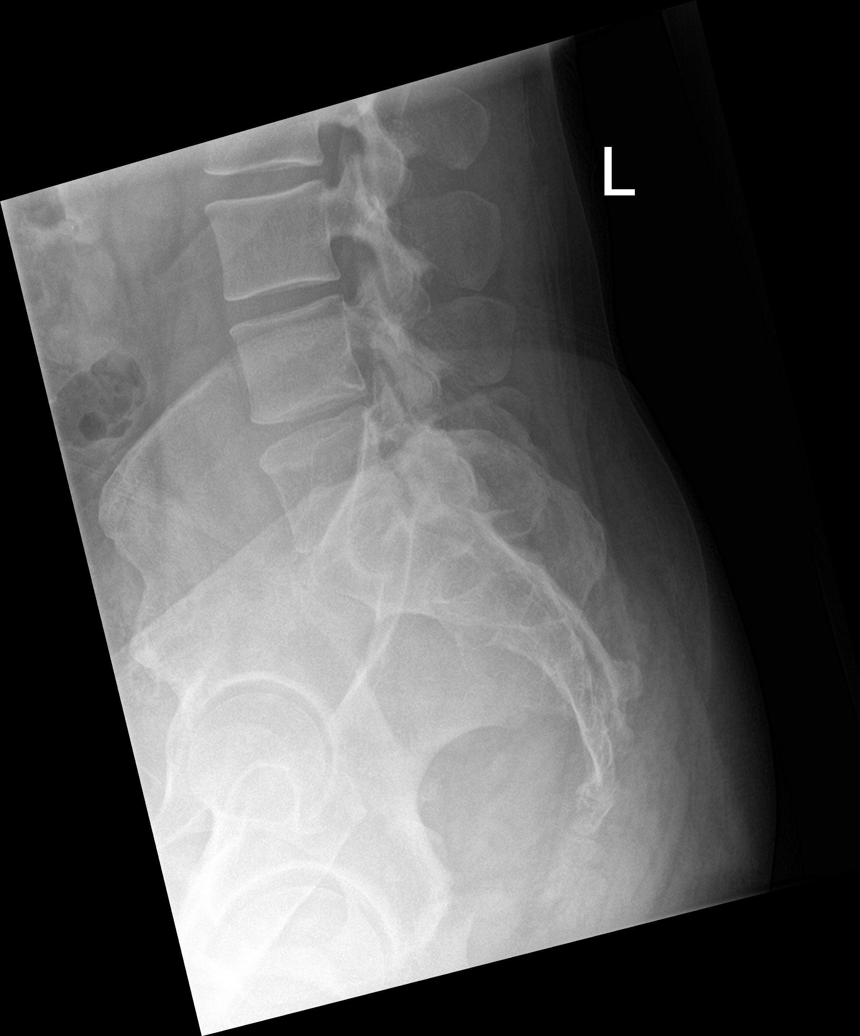

[5 of 5 positions shown; findings below may reference images not displayed]

FINDINGS: There is no evidence of lumbar spine fracture. Alignment is normal.
Intervertebral disc spaces are maintained.
IMPRESSION: Negative.

## 2022-01-31 ENCOUNTER — Telehealth: Payer: Self-pay

## 2022-01-31 NOTE — Telephone Encounter (Addendum)
Initiated Prior authorization HSV:EXOGAC 2.4MG/0.75ML auto-injectors Via: Covermymeds Case/Key:[id number] Status: denied as of 01/31/22 Reason:criteria not met Notified Pt via: pt does not have Mychart

## 2022-02-27 ENCOUNTER — Telehealth: Payer: Self-pay

## 2022-02-27 NOTE — Telephone Encounter (Addendum)
Initiated Prior authorization ICH:TVGVSY 2.4MG/0.75ML auto-injectors Via: Covermymeds Case/Key:[id number] Status: denied  as of 02/27/2022 Reason:Criteria not met  Notified Pt via: Mychart

## 2022-03-05 MED ORDER — AMBULATORY NON FORMULARY MEDICATION: 5 refills | Status: DC

## 2022-03-06 NOTE — Telephone Encounter (Signed)
RX faxed to [facility] at [phone] with confirmation received.

## 2022-05-02 ENCOUNTER — Other Ambulatory Visit: Payer: Self-pay | Admitting: Physician Assistant

## 2022-05-28 ENCOUNTER — Encounter: Payer: Self-pay | Admitting: Physician Assistant

## 2022-05-28 ENCOUNTER — Ambulatory Visit (INDEPENDENT_AMBULATORY_CARE_PROVIDER_SITE_OTHER): Payer: BC Managed Care – PPO | Admitting: Physician Assistant

## 2022-05-28 VITALS — BP 136/94 | HR 98 | Ht 71.0 in | Wt 196.0 lb

## 2022-05-28 DIAGNOSIS — E663 Overweight: Secondary | ICD-10-CM | POA: Diagnosis not present

## 2022-05-28 DIAGNOSIS — R7301 Impaired fasting glucose: Secondary | ICD-10-CM

## 2022-05-28 DIAGNOSIS — Z1329 Encounter for screening for other suspected endocrine disorder: Secondary | ICD-10-CM

## 2022-05-28 DIAGNOSIS — E785 Hyperlipidemia, unspecified: Secondary | ICD-10-CM | POA: Diagnosis not present

## 2022-05-28 DIAGNOSIS — M51369 Other intervertebral disc degeneration, lumbar region without mention of lumbar back pain or lower extremity pain: Secondary | ICD-10-CM

## 2022-05-28 DIAGNOSIS — D649 Anemia, unspecified: Secondary | ICD-10-CM | POA: Diagnosis not present

## 2022-05-28 DIAGNOSIS — M5136 Other intervertebral disc degeneration, lumbar region: Secondary | ICD-10-CM

## 2022-05-28 DIAGNOSIS — Z8639 Personal history of other endocrine, nutritional and metabolic disease: Secondary | ICD-10-CM

## 2022-05-28 MED ORDER — CYCLOBENZAPRINE HCL 10 MG PO TABS: ORAL_TABLET | ORAL | 3 refills | Status: DC

## 2022-05-28 NOTE — Progress Notes (Signed)
Established Patient Office Visit  Subjective   Patient ID: Jonathan Shaw, male    DOB: 1970/09/17  Age: 51 y.o. MRN: [id number]  Chief Complaint  Patient presents with   Follow-up    HPI Pt is a 51 yo male with history of obesity, HLD, lumbar DDD who presents to the clinic for medication follow up.   Pt is doing great. He really wants to be able to continue on wegovy. He is on it through mail order pharmacy but very costly. He is exercising and eating better. He needs letter for appeal to insurance.   .. Active Ambulatory Problems    Diagnosis Date Noted   Seasonal allergies 10/11/2014   Class 1 obesity due to excess calories without serious comorbidity with body mass index (BMI) of 33.0 to 33.9 in adult 10/11/2014   Abnormal weight gain 10/11/2014   Hyperlipidemia 10/14/2014   Dyslipidemia (high LDL; low HDL) 10/13/2015   Constipation 11/21/2015   Heel pain 11/21/2015   Elevated fasting glucose 02/21/2016   Non-recurrent unilateral inguinal hernia without obstruction or gangrene 07/28/2018   Diverticulosis 07/28/2018   Snoring 03/19/2019   Microcytic anemia 06/25/2019   Chronic right-sided low back pain with right-sided sciatica 06/23/2020   DDD (degenerative disc disease), lumbar 07/03/2020   Right lower quadrant abdominal pain 01/23/2021   Abdominal bloating 01/23/2021   Hematuria 01/23/2021   Overweight (BMI 25.0-29.9) 05/28/2022   History of obesity 05/28/2022   Resolved Ambulatory Problems    Diagnosis Date Noted   Pharyngitis 05/27/2009   Swollen uvula 03/19/2019   Past Medical History:  Diagnosis Date   High cholesterol      Review of Systems  All other systems reviewed and are negative.     Objective:     BP (!) 136/94   Pulse 98   Ht '5\' 11"'$  (1.803 m)   Wt 196 lb (88.9 kg)   SpO2 100%   BMI 27.34 kg/m  BP Readings from Last 3 Encounters:  05/28/22 (!) 136/94  12/31/21 (!) 117/98  07/03/21 116/85   Wt Readings from Last 3 Encounters:   05/28/22 196 lb (88.9 kg)  12/31/21 199 lb (90.3 kg)  07/03/21 227 lb (103 kg)      Physical Exam Constitutional:      Appearance: Normal appearance.  HENT:     Head: Normocephalic.  Cardiovascular:     Rate and Rhythm: Normal rate and regular rhythm.  Pulmonary:     Effort: Pulmonary effort is normal.  Neurological:     General: No focal deficit present.     Mental Status: He is alert and oriented to person, place, and time.  Psychiatric:        Mood and Affect: Mood normal.        Assessment & Plan:  .Marland KitchenKenly was seen today for follow-up.  Diagnoses and all orders for this visit:  Overweight (BMI 25.0-29.9)  Dyslipidemia (high LDL; low HDL) -     Lipid Panel w/reflex Direct LDL  Low hemoglobin -     CBC with Differential/Platelet  Elevated fasting glucose -     COMPLETE METABOLIC PANEL WITH GFR  Thyroid disorder screen -     TSH  DDD (degenerative disc disease), lumbar -     cyclobenzaprine (FLEXERIL) 10 MG tablet; TAKE 1 TABLET BY MOUTH THREE TIMES A DAY  History of obesity   Pt is buying wegovy via [facility] Letter of appeal sent to insurance company to get approved and through  local pharmacy Continues to lose weight and feeling great  Fasting labs ordered  Flexeril refilled for intermittent low back pain    Return in about 6 months (around 11/27/2022).    Iran Planas, PA-C

## 2022-06-12 ENCOUNTER — Other Ambulatory Visit: Payer: Self-pay | Admitting: Physician Assistant

## 2022-06-12 DIAGNOSIS — Z1329 Encounter for screening for other suspected endocrine disorder: Secondary | ICD-10-CM | POA: Diagnosis not present

## 2022-06-12 DIAGNOSIS — E785 Hyperlipidemia, unspecified: Secondary | ICD-10-CM | POA: Diagnosis not present

## 2022-06-12 DIAGNOSIS — R7301 Impaired fasting glucose: Secondary | ICD-10-CM | POA: Diagnosis not present

## 2022-06-12 DIAGNOSIS — D649 Anemia, unspecified: Secondary | ICD-10-CM | POA: Diagnosis not present

## 2022-06-13 LAB — CBC WITH DIFFERENTIAL/PLATELET
Absolute Monocytes: 479 cells/uL (ref 200–950)
Basophils Absolute: 39 cells/uL (ref 0–200)
Basophils Relative: 0.7 %
Eosinophils Absolute: 61 cells/uL (ref 15–500)
Eosinophils Relative: 1.1 %
HCT: 43.1 % (ref 38.5–50.0)
Hemoglobin: 14.6 g/dL (ref 13.2–17.1)
Lymphs Abs: 1744 cells/uL (ref 850–3900)
MCH: 29.4 pg (ref 27.0–33.0)
MCHC: 33.9 g/dL (ref 32.0–36.0)
MCV: 86.9 fL (ref 80.0–100.0)
MPV: 9.5 fL (ref 7.5–12.5)
Monocytes Relative: 8.7 %
Neutro Abs: 3179 cells/uL (ref 1500–7800)
Neutrophils Relative %: 57.8 %
Platelets: 367 10*3/uL (ref 140–400)
RBC: 4.96 10*6/uL (ref 4.20–5.80)
RDW: 12.8 % (ref 11.0–15.0)
Total Lymphocyte: 31.7 %
WBC: 5.5 10*3/uL (ref 3.8–10.8)

## 2022-06-13 LAB — COMPLETE METABOLIC PANEL WITH GFR
AG Ratio: 1.7 (calc) (ref 1.0–2.5)
ALT: 22 U/L (ref 9–46)
AST: 18 U/L (ref 10–40)
Albumin: 4.7 g/dL (ref 3.6–5.1)
Alkaline phosphatase (APISO): 93 U/L (ref 36–130)
BUN: 9 mg/dL (ref 7–25)
CO2: 30 mmol/L (ref 20–32)
Calcium: 9.9 mg/dL (ref 8.6–10.3)
Chloride: 103 mmol/L (ref 98–110)
Creat: 0.64 mg/dL (ref 0.60–1.29)
Globulin: 2.8 g/dL (calc) (ref 1.9–3.7)
Glucose, Bld: 98 mg/dL (ref 65–99)
Potassium: 4.4 mmol/L (ref 3.5–5.3)
Sodium: 139 mmol/L (ref 135–146)
Total Bilirubin: 0.6 mg/dL (ref 0.2–1.2)
Total Protein: 7.5 g/dL (ref 6.1–8.1)
eGFR: 116 mL/min/{1.73_m2} (ref >=60)

## 2022-06-13 LAB — LIPID PANEL W/REFLEX DIRECT LDL
Cholesterol: 140 mg/dL (ref <200)
HDL: 57 mg/dL (ref >=40)
LDL Cholesterol (Calc): 67 mg/dL (calc)
Non-HDL Cholesterol (Calc): 83 mg/dL (calc) (ref <130)
Total CHOL/HDL Ratio: 2.5 (calc) (ref <5.0)
Triglycerides: 82 mg/dL (ref <150)

## 2022-06-13 LAB — TSH: TSH: 0.9 mIU/L (ref 0.40–4.50)

## 2022-06-13 NOTE — Progress Notes (Signed)
Joeph,  Cholesterol looks great.  TG much better.  Thyroid normal.  Kidney, liver, calcium look great.  Hemoglobin looks wonderful.   Labs look amazing.

## 2022-07-10 ENCOUNTER — Ambulatory Visit: Payer: BC Managed Care – PPO | Admitting: Physician Assistant

## 2022-09-04 ENCOUNTER — Other Ambulatory Visit: Payer: Self-pay | Admitting: Physician Assistant

## 2022-09-04 DIAGNOSIS — E785 Hyperlipidemia, unspecified: Secondary | ICD-10-CM

## 2022-09-30 ENCOUNTER — Encounter: Payer: Self-pay | Admitting: *Deleted

## 2022-11-20 ENCOUNTER — Encounter: Payer: Self-pay | Admitting: Physician Assistant

## 2022-11-20 ENCOUNTER — Ambulatory Visit (INDEPENDENT_AMBULATORY_CARE_PROVIDER_SITE_OTHER): Payer: BC Managed Care – PPO | Admitting: Physician Assistant

## 2022-11-20 VITALS — BP 112/72 | HR 78 | Ht 71.0 in | Wt 184.0 lb

## 2022-11-20 DIAGNOSIS — K5903 Drug induced constipation: Secondary | ICD-10-CM | POA: Diagnosis not present

## 2022-11-20 DIAGNOSIS — E785 Hyperlipidemia, unspecified: Secondary | ICD-10-CM | POA: Diagnosis not present

## 2022-11-20 DIAGNOSIS — Z8639 Personal history of other endocrine, nutritional and metabolic disease: Secondary | ICD-10-CM | POA: Diagnosis not present

## 2022-11-20 DIAGNOSIS — M5136 Other intervertebral disc degeneration, lumbar region: Secondary | ICD-10-CM

## 2022-11-20 DIAGNOSIS — E663 Overweight: Secondary | ICD-10-CM | POA: Diagnosis not present

## 2022-11-20 DIAGNOSIS — M51369 Other intervertebral disc degeneration, lumbar region without mention of lumbar back pain or lower extremity pain: Secondary | ICD-10-CM

## 2022-11-20 MED ORDER — WEGOVY 2.4 MG/0.75ML ~~LOC~~ SOAJ: 2.4000 mg | SUBCUTANEOUS | 1 refills | Status: DC

## 2022-11-20 MED ORDER — TRULANCE 3 MG PO TABS: ORAL_TABLET | ORAL | 3 refills | Status: DC

## 2022-11-20 NOTE — Patient Instructions (Signed)

## 2022-11-20 NOTE — Progress Notes (Signed)
Established Patient Office Visit  Subjective   Patient ID: Jonathan Shaw, male    DOB: [birth year]  Age: 52 y.o. MRN: [id number]  Chief Complaint  Patient presents with   Follow-up    HPI Patient is a 52 year old male with history of obesity, dyslipidemia, lumbar degenerative disc disease who presents to the clinic to follow-up on Wegovy.  He has had amazing results on this medication.  He has lost another 10 pounds in the last 6 months on this medication.  He is much more active.  He is biking and hiking.  His mood is very well-controlled.  He has less low back pain due to weight loss.  He has no concerns.  He does struggle some with constipation.  He is using over-the-counter medications but does not seem to keep him regular like he would like to be.  At times he has some abdominal pain if he has not had a bowel movement recently.  Denies any significant nausea or vomiting.  .. Active Ambulatory Problems    Diagnosis Date Noted   Seasonal allergies 10/11/2014   Class 1 obesity due to excess calories without serious comorbidity with body mass index (BMI) of 33.0 to 33.9 in adult 10/11/2014   Abnormal weight gain 10/11/2014   Hyperlipidemia 10/14/2014   Dyslipidemia (high LDL; low HDL) 10/13/2015   Constipation 11/21/2015   Heel pain 11/21/2015   Elevated fasting glucose 02/21/2016   Non-recurrent unilateral inguinal hernia without obstruction or gangrene 07/28/2018   Diverticulosis 07/28/2018   Snoring 03/19/2019   Microcytic anemia 06/25/2019   Chronic right-sided low back pain with right-sided sciatica 06/23/2020   DDD (degenerative disc disease), lumbar 07/03/2020   Right lower quadrant abdominal pain 01/23/2021   Abdominal bloating 01/23/2021   Hematuria 01/23/2021   Overweight (BMI 25.0-29.9) 05/28/2022   History of obesity 05/28/2022   Resolved Ambulatory Problems    Diagnosis Date Noted   Pharyngitis 05/27/2009   Swollen uvula 03/19/2019   Past Medical History:   Diagnosis Date   High cholesterol     ROS   See HPI.  Objective:     BP 112/72   Pulse 78   Ht 5\' 11"  (1.803 m)   Wt 184 lb (83.5 kg)   SpO2 99%   BMI 25.66 kg/m  BP Readings from Last 3 Encounters:  11/20/22 112/72  05/28/22 (!) 136/94  12/31/21 (!) 117/98   Wt Readings from Last 3 Encounters:  11/20/22 184 lb (83.5 kg)  05/28/22 196 lb (88.9 kg)  12/31/21 199 lb (90.3 kg)      Physical Exam Constitutional:      Appearance: Normal appearance.  HENT:     Head: Normocephalic.  Cardiovascular:     Rate and Rhythm: Normal rate and regular rhythm.  Pulmonary:     Effort: Pulmonary effort is normal.     Breath sounds: Normal breath sounds.  Neurological:     General: No focal deficit present.     Mental Status: He is alert and oriented to person, place, and time.  Psychiatric:        Mood and Affect: Mood normal.      The 10-year ASCVD risk score (Arnett DK, et al., 2019) is: 1.4%    Assessment & Plan:  .Marland KitchenAftab was seen today for follow-up.  Diagnoses and all orders for this visit:  History of obesity -     Semaglutide-Weight Management (WEGOVY) 2.4 MG/0.75ML SOAJ; Inject 2.4 mg into the skin once a week.  Drug-induced constipation -     Plecanatide (TRULANCE) 3 MG TABS; Take one tablet daily.  Overweight (BMI 25.0-29.9) -     Semaglutide-Weight Management (WEGOVY) 2.4 MG/0.75ML SOAJ; Inject 2.4 mg into the skin once a week.  Dyslipidemia (high LDL; low HDL) -     Semaglutide-Weight Management (WEGOVY) 2.4 MG/0.75ML SOAJ; Inject 2.4 mg into the skin once a week.  DDD (degenerative disc disease), lumbar -     Semaglutide-Weight Management (WEGOVY) 2.4 MG/0.75ML SOAJ; Inject 2.4 mg into the skin once a week.   Patient is responding well to medication.  Will refill Wegovy for another 6 months.  BMI today is 25 and just in the overweight scale.  Patient continues to be very active and keeping a healthy diet.  He is struggling with some constipation.   He has tried over-the-counter MiraLAX, Dulcolax, Colace and even some herbal remedies with no benefit.  Sent Trulance to take daily to pharmacy.  Continue to drink lots of water and stay hydrated.  Follow-up in 6 months.    Tandy Gaw, PA-C

## 2023-02-28 ENCOUNTER — Other Ambulatory Visit: Payer: Self-pay | Admitting: Physician Assistant

## 2023-02-28 DIAGNOSIS — M5136 Other intervertebral disc degeneration, lumbar region: Secondary | ICD-10-CM

## 2023-04-03 ENCOUNTER — Other Ambulatory Visit: Payer: Self-pay | Admitting: Physician Assistant

## 2023-04-03 DIAGNOSIS — E785 Hyperlipidemia, unspecified: Secondary | ICD-10-CM

## 2023-05-21 ENCOUNTER — Ambulatory Visit (INDEPENDENT_AMBULATORY_CARE_PROVIDER_SITE_OTHER): Payer: BC Managed Care – PPO | Admitting: Physician Assistant

## 2023-05-21 ENCOUNTER — Encounter: Payer: Self-pay | Admitting: Physician Assistant

## 2023-05-21 VITALS — BP 127/78 | HR 92 | Ht 71.0 in | Wt 175.2 lb

## 2023-05-21 DIAGNOSIS — Z79899 Other long term (current) drug therapy: Secondary | ICD-10-CM | POA: Diagnosis not present

## 2023-05-21 DIAGNOSIS — Z8639 Personal history of other endocrine, nutritional and metabolic disease: Secondary | ICD-10-CM

## 2023-05-21 DIAGNOSIS — E663 Overweight: Secondary | ICD-10-CM | POA: Diagnosis not present

## 2023-05-21 DIAGNOSIS — K5903 Drug induced constipation: Secondary | ICD-10-CM

## 2023-05-21 DIAGNOSIS — M5136 Other intervertebral disc degeneration, lumbar region with discogenic back pain only: Secondary | ICD-10-CM

## 2023-05-21 DIAGNOSIS — Z0000 Encounter for general adult medical examination without abnormal findings: Secondary | ICD-10-CM

## 2023-05-21 DIAGNOSIS — Z125 Encounter for screening for malignant neoplasm of prostate: Secondary | ICD-10-CM | POA: Diagnosis not present

## 2023-05-21 DIAGNOSIS — E785 Hyperlipidemia, unspecified: Secondary | ICD-10-CM

## 2023-05-21 MED ORDER — WEGOVY 2.4 MG/0.75ML ~~LOC~~ SOAJ
2.4000 mg | SUBCUTANEOUS | 1 refills | Status: DC
Start: 2023-05-21 — End: 2023-08-18

## 2023-05-21 MED ORDER — LINACLOTIDE 145 MCG PO CAPS
145.0000 ug | ORAL_CAPSULE | Freq: Every day | ORAL | 3 refills | Status: DC
Start: 2023-05-21 — End: 2023-11-19

## 2023-05-21 NOTE — Progress Notes (Signed)
Established Patient Office Visit  Subjective   Patient ID: Jonathan Shaw, male    DOB: January 15, 1971  Age: 52 y.o. MRN: [id number]  Chief Complaint  Patient presents with   Medical Management of Chronic Issues    6 mo follow up on wgt management      HPI 52 yo male presents today for 6 mo weight loss f/u. Still on Wegovy. Weight 175 lbs today with a total of 55 lbs lost. Physical activity has increased and includes cardio and strength training; Averages 15-20 miles per week. Back does not hurt as much. Diet is good. Reports decreased bowel movement frequency and takes OTC laxatives and Mg sulfate, tried Trulance which didn't work. His stools are either liquid or "rabbit pellets". Denies straining, blood in stool, and abdominal pain.  Review of Systems  Gastrointestinal:  Positive for constipation and diarrhea. Negative for abdominal pain, blood in stool, nausea and vomiting.     Objective:     BP 127/78   Pulse 92   Ht 5\' 11"  (1.803 m)   Wt 175 lb 3.2 oz (79.5 kg)   SpO2 99%   BMI 24.44 kg/m  Wt Readings from Last 3 Encounters:  05/21/23 175 lb 3.2 oz (79.5 kg)  11/20/22 184 lb (83.5 kg)  05/28/22 196 lb (88.9 kg)   Physical Exam Constitutional:      Appearance: Normal appearance.  HENT:     Head: Normocephalic.  Cardiovascular:     Rate and Rhythm: Normal rate and regular rhythm.     Heart sounds: Normal heart sounds.  Pulmonary:     Effort: Pulmonary effort is normal.     Breath sounds: Normal breath sounds.  Neurological:     Mental Status: He is alert and oriented to person, place, and time.  Psychiatric:        Mood and Affect: Mood normal.    The 10-year ASCVD risk score (Arnett DK, et al., 2019) is: 1.8%    Assessment & Plan:  .Marland KitchenCabel was seen today for medical management of chronic issues.  Diagnoses and all orders for this visit:  Drug-induced constipation -     linaclotide (LINZESS) 145 MCG CAPS capsule; Take 1 capsule (145 mcg total) by mouth  daily.  History of obesity -     Semaglutide-Weight Management (WEGOVY) 2.4 MG/0.75ML SOAJ; Inject 2.4 mg into the skin once a week.  Dyslipidemia (high LDL; low HDL) -     Semaglutide-Weight Management (WEGOVY) 2.4 MG/0.75ML SOAJ; Inject 2.4 mg into the skin once a week. -     Lipid panel  Medication management  Overweight (BMI 25.0-29.9) -     Lipid panel -     CBC w/Diff/Platelet -     CMP14+EGFR -     TSH  Prostate cancer screening -     PSA  Preventative health care -     Lipid panel -     CBC w/Diff/Platelet -     PSA -     CMP14+EGFR -     TSH  Degeneration of intervertebral disc of lumbar region with discogenic back pain -     Semaglutide-Weight Management (WEGOVY) 2.4 MG/0.75ML SOAJ; Inject 2.4 mg into the skin once a week.    Patient is responding well to medication.   Vitals look great.  Will refill Wegovy for another 6 months for maintance.  BMI today is 24 and in healthy weight scale.   Continue to exercise, including strength training to prevent muscle  wasting. Consume at least 1200 calories/day and increase protein intake. He is struggling with some constipation and has tried many OTC remedies and Trulance with little to no benefit.  Started Linzess for constipation.  Recommended probiotics. Continue to drink lots of water and stay hydrated.   Follow-up in 6 months.  Ordered fasting labs.   Return in about 6 months (around 11/19/2023) for weight check.   Tandy Gaw, PA-C

## 2023-05-22 LAB — CMP14+EGFR
ALT: 25 [IU]/L (ref 0–44)
AST: 22 [IU]/L (ref 0–40)
Albumin: 4.7 g/dL (ref 4.1–5.1)
Alkaline Phosphatase: 89 [IU]/L (ref 44–121)
BUN/Creatinine Ratio: 14 (ref 9–20)
BUN: 12 mg/dL (ref 6–24)
Bilirubin Total: 0.5 mg/dL (ref 0.0–1.2)
CO2: 25 mmol/L (ref 20–29)
Calcium: 9.7 mg/dL (ref 8.7–10.2)
Chloride: 101 mmol/L (ref 96–106)
Creatinine, Ser: 0.86 mg/dL (ref 0.76–1.27)
Globulin, Total: 2.3 g/dL (ref 1.5–4.5)
Glucose: 92 mg/dL (ref 70–99)
Potassium: 4.9 mmol/L (ref 3.5–5.2)
Sodium: 141 mmol/L (ref 134–144)
Total Protein: 7 g/dL (ref 6.0–8.5)
eGFR: 105 mL/min/{1.73_m2} (ref >59)

## 2023-05-22 LAB — CBC WITH DIFFERENTIAL/PLATELET
Basophils Absolute: 0.1 10*3/uL (ref 0.0–0.2)
Basos: 1 %
EOS (ABSOLUTE): 0.1 10*3/uL (ref 0.0–0.4)
Eos: 1 %
Hematocrit: 38.5 % (ref 37.5–51.0)
Hemoglobin: 11.9 g/dL — ABNORMAL LOW (ref 13.0–17.7)
Immature Grans (Abs): 0 10*3/uL (ref 0.0–0.1)
Immature Granulocytes: 0 %
Lymphocytes Absolute: 1.7 10*3/uL (ref 0.7–3.1)
Lymphs: 41 %
MCH: 27.5 pg (ref 26.6–33.0)
MCHC: 30.9 g/dL — ABNORMAL LOW (ref 31.5–35.7)
MCV: 89 fL (ref 79–97)
Monocytes Absolute: 0.4 10*3/uL (ref 0.1–0.9)
Monocytes: 10 %
Neutrophils Absolute: 2 10*3/uL (ref 1.4–7.0)
Neutrophils: 47 %
Platelets: 409 10*3/uL (ref 150–450)
RBC: 4.32 x10E6/uL (ref 4.14–5.80)
RDW: 13.1 % (ref 11.6–15.4)
WBC: 4.2 10*3/uL (ref 3.4–10.8)

## 2023-05-22 LAB — TSH: TSH: 1.21 u[IU]/mL (ref 0.450–4.500)

## 2023-05-22 LAB — LIPID PANEL
Chol/HDL Ratio: 2.4 {ratio} (ref 0.0–5.0)
Cholesterol, Total: 134 mg/dL (ref 100–199)
HDL: 56 mg/dL (ref >39)
LDL Chol Calc (NIH): 65 mg/dL (ref 0–99)
Triglycerides: 62 mg/dL (ref 0–149)
VLDL Cholesterol Cal: 13 mg/dL (ref 5–40)

## 2023-05-22 LAB — PSA: Prostate Specific Ag, Serum: 1.2 ng/mL (ref 0.0–4.0)

## 2023-05-23 ENCOUNTER — Encounter: Payer: Self-pay | Admitting: Physician Assistant

## 2023-05-23 DIAGNOSIS — D649 Anemia, unspecified: Secondary | ICD-10-CM | POA: Insufficient documentation

## 2023-05-23 NOTE — Progress Notes (Signed)
Jonathan Shaw,   Cholesterol looks fantastic.  Prostate enzyme normal.  Kidney, liver, glucose look wonderful.  Thyroid looks GREAT.  Hemoglobin is down some. It could be diet related and need to start an iron supplement. My concern with iron supplement is worsening your constipation.  Please add serum iron and ferritin.

## 2023-05-29 ENCOUNTER — Other Ambulatory Visit: Payer: Self-pay

## 2023-05-29 ENCOUNTER — Telehealth: Payer: Self-pay

## 2023-05-29 DIAGNOSIS — D649 Anemia, unspecified: Secondary | ICD-10-CM

## 2023-05-29 NOTE — Telephone Encounter (Signed)
Spoke with patient.

## 2023-05-29 NOTE — Telephone Encounter (Signed)
Copied from CRM [id number]. Topic: General - Call Back - No Documentation >> May 28, 2023 10:44 AM Desma Mcgregor wrote: Reason for CRM: Karell calling back to speak with Selena Batten regarding labs. Please contact patient back on his mobile #.

## 2023-05-29 NOTE — Telephone Encounter (Signed)
Attempted call to patient on cell # [phone] Left voice mail message requesting a return call.

## 2023-05-30 DIAGNOSIS — D649 Anemia, unspecified: Secondary | ICD-10-CM | POA: Diagnosis not present

## 2023-05-31 LAB — IRON: Iron: 19 ug/dL — ABNORMAL LOW (ref 38–169)

## 2023-05-31 LAB — FERRITIN: Ferritin: 8 ng/mL — ABNORMAL LOW (ref 30–400)

## 2023-06-02 NOTE — Progress Notes (Signed)
Iron stores and serum iron low. You need to eat more iron rich foods. Could you try OTC iron supplement with breakfast?

## 2023-07-13 ENCOUNTER — Other Ambulatory Visit: Payer: Self-pay | Admitting: Physician Assistant

## 2023-07-13 DIAGNOSIS — M51369 Other intervertebral disc degeneration, lumbar region without mention of lumbar back pain or lower extremity pain: Secondary | ICD-10-CM

## 2023-08-17 ENCOUNTER — Other Ambulatory Visit: Payer: Self-pay | Admitting: Physician Assistant

## 2023-08-17 ENCOUNTER — Encounter: Payer: Self-pay | Admitting: Physician Assistant

## 2023-08-17 DIAGNOSIS — E785 Hyperlipidemia, unspecified: Secondary | ICD-10-CM

## 2023-08-17 DIAGNOSIS — Z8639 Personal history of other endocrine, nutritional and metabolic disease: Secondary | ICD-10-CM

## 2023-08-17 DIAGNOSIS — M79671 Pain in right foot: Secondary | ICD-10-CM

## 2023-08-17 DIAGNOSIS — M5136 Other intervertebral disc degeneration, lumbar region with discogenic back pain only: Secondary | ICD-10-CM

## 2023-08-28 ENCOUNTER — Ambulatory Visit: Admitting: Family Medicine

## 2023-08-28 VITALS — BP 120/86 | Ht 71.0 in | Wt 172.0 lb

## 2023-08-28 DIAGNOSIS — M545 Low back pain, unspecified: Secondary | ICD-10-CM

## 2023-08-28 DIAGNOSIS — G8929 Other chronic pain: Secondary | ICD-10-CM | POA: Diagnosis not present

## 2023-08-28 DIAGNOSIS — R269 Unspecified abnormalities of gait and mobility: Secondary | ICD-10-CM

## 2023-08-28 NOTE — Progress Notes (Signed)
 DATE OF VISIT: 08/28/2023        Jonathan Shaw DOB: Aug 22, 1970 MRN: [id number]  CC:  custom orthotics  History of present Illness: Jonathan Shaw is a 53 y.o. male who presents for a follow-up visit for custom orthotics Last seen 07/19/20 by Dr Clare Gandy for custom orthotics - made for chronic LBP - exacerbated by standing for prolonged periods - made 2 pair last time -- size 14; he thinks these were just a little big - wears size 13 shoes Starting to have more back pain Starting to feel like arches are falling some  Medications:  Outpatient Encounter Medications as of 08/28/2023  Medication Sig   atorvastatin (LIPITOR) 80 MG tablet TAKE 1 TABLET (80 MG TOTAL) BY MOUTH DAILY. LABS FOR REFILLS   cetirizine (ZYRTEC) 5 MG tablet Take 1 tablet (5 mg total) by mouth daily as needed for allergies.   cyclobenzaprine (FLEXERIL) 10 MG tablet TAKE 1 TABLET BY MOUTH THREE TIMES A DAY   fluticasone (FLONASE) 50 MCG/ACT nasal spray SPRAY 2 SPRAYS INTO EACH NOSTRIL EVERY DAY   lidocaine (LIDODERM) 5 % Remove & Discard patch within 12 hours or as directed by MD   linaclotide (LINZESS) 145 MCG CAPS capsule Take 1 capsule (145 mcg total) by mouth daily.   Multiple Vitamin (MULTIVITAMIN) tablet Take 1 tablet by mouth daily.   Semaglutide-Weight Management (WEGOVY) 2.4 MG/0.75ML SOAJ INJECT 2.4 MG INTO THE SKIN ONCE A WEEK.   vitamin B-12 (CYANOCOBALAMIN) 500 MCG tablet Take 500 mcg by mouth daily.   vitamin C (ASCORBIC ACID) 500 MG tablet Take 500 mg by mouth daily.   No facility-administered encounter medications on file as of 08/28/2023.    Allergies: is allergic to ondansetron.  Physical Examination: Vitals: BP 120/86   Ht 5\' 11"  (1.803 m)   Wt 172 lb (78 kg)   BMI 23.99 kg/m  GENERAL:  Jonathan Shaw is a 53 y.o. male appearing their stated age, alert and oriented x 3, in no apparent distress.  MSK: FOOT/ANKLE: Bilateral foot with slight mild arch collapse.  Transverse arch is relatively  preserved.  Slight hammering of second and third toes bilaterally.  No splaying.  Good range of motion of the ankles.  No significant callus formation N/V/I distally  ASSESSMENT 1. Chronic low back pain, unspecified back pain laterality, unspecified whether sciatica present 2. Abnormality of gait Chronic low back pain with associated gait abnormality.  Has done well with custom orthotics in the past, current pair has been worn over the past 3 years and is no longer providing adequate support.  He would like new orthotics today  PLAN 1.  New orthotics were completed and fabricated today.  These were comfortable and he had more neutral gait.  3 sets of orthotics made in the office today.  He plans to wear each in a different set of his shoes  Orthotics Preparation Patient was fitted for a : Fit 'n Run semi-rigid orthotic  The orthotic was heated, placed on the orthotic stand. The patient was positioned in subtalar neutral position and 10 degrees of ankle dorsiflexion in a weight bearing stance on the heated orthotic blank After completion of molding Blank: Fit 'n Run - size 13 Pairs: 3 pair Posting:  none Base: none  Orthotics were comfortable in the office today and he had a more neutral gait  2. The patient should return to see me prn.  The typical lifespan is 1 to 3 years orthotics, he may  get more time since he has multiple pair.  He can follow-up at his convenience for new orthotics.    Patient expressed understanding & agreement with above.  40 mins spent on encounter today including face-to-face time, preparation to see the patient, as well as: This time  included evaluation of foot pathology, associated joint issues and gait abnormalities.  Counseling patient regarding foot/ ankle /associated joint problems and gait issues, discussion of therapeutic options, measurement and manufacture of custom molded orthotic as detailed above.  Encounter Diagnoses  Name Primary?   Chronic low  back pain, unspecified back pain laterality, unspecified whether sciatica present Yes   Abnormality of gait     No orders of the defined types were placed in this encounter.

## 2023-08-28 NOTE — Patient Instructions (Signed)
 Thank you for coming to see me today. It was a pleasure.   We made you new orthotics. Let us know if we need to add any extra cushioning or make changes.  If you have any questions or concerns, please do not hesitate to call the office at [phone].

## 2023-08-30 ENCOUNTER — Other Ambulatory Visit: Payer: Self-pay | Admitting: Physician Assistant

## 2023-08-30 DIAGNOSIS — E785 Hyperlipidemia, unspecified: Secondary | ICD-10-CM

## 2023-08-30 DIAGNOSIS — Z8639 Personal history of other endocrine, nutritional and metabolic disease: Secondary | ICD-10-CM

## 2023-08-30 DIAGNOSIS — M5136 Other intervertebral disc degeneration, lumbar region with discogenic back pain only: Secondary | ICD-10-CM

## 2023-09-24 ENCOUNTER — Other Ambulatory Visit (HOSPITAL_COMMUNITY): Payer: Self-pay

## 2023-09-26 ENCOUNTER — Telehealth: Payer: Self-pay

## 2023-09-26 ENCOUNTER — Telehealth: Payer: Self-pay | Admitting: Physician Assistant

## 2023-09-26 ENCOUNTER — Other Ambulatory Visit (HOSPITAL_COMMUNITY): Payer: Self-pay

## 2023-09-26 NOTE — Telephone Encounter (Signed)
 Pharmacy Patient Advocate Encounter   Received notification from Patient Pharmacy that prior authorization for Wegovy 2.4 is required/requested.   Insurance verification completed.   The patient is insured through [facility] .   Per test claim: PA required; PA submitted to above mentioned insurance via CoverMyMeds Key/confirmation #/EOC [id number] Status is pending

## 2023-09-26 NOTE — Telephone Encounter (Signed)
 Patient is request PA for [facility] Please advise.

## 2023-09-29 ENCOUNTER — Other Ambulatory Visit (HOSPITAL_COMMUNITY): Payer: Self-pay

## 2023-09-29 NOTE — Telephone Encounter (Signed)
 Pharmacy Patient Advocate Encounter  Received notification from [facility] that Prior Authorization for Wegovy 2.4 has been APPROVED from 09/26/23 to 09/25/24. Ran test claim, Copay is $24.99 for 28 day supply. This test claim was processed through [facility]- copay amounts may vary at other pharmacies due to pharmacy/plan contracts, or as the patient moves through the different stages of their insurance plan.   PA #/Case ID/Reference #:  [id number]

## 2023-11-15 ENCOUNTER — Other Ambulatory Visit: Payer: Self-pay | Admitting: Physician Assistant

## 2023-11-15 DIAGNOSIS — M51369 Other intervertebral disc degeneration, lumbar region without mention of lumbar back pain or lower extremity pain: Secondary | ICD-10-CM

## 2023-11-19 ENCOUNTER — Ambulatory Visit (INDEPENDENT_AMBULATORY_CARE_PROVIDER_SITE_OTHER): Payer: BC Managed Care – PPO | Admitting: Physician Assistant

## 2023-11-19 ENCOUNTER — Encounter: Payer: Self-pay | Admitting: Physician Assistant

## 2023-11-19 VITALS — BP 105/86 | HR 86 | Ht 71.0 in | Wt 173.8 lb

## 2023-11-19 DIAGNOSIS — Z8639 Personal history of other endocrine, nutritional and metabolic disease: Secondary | ICD-10-CM | POA: Diagnosis not present

## 2023-11-19 DIAGNOSIS — M5136 Other intervertebral disc degeneration, lumbar region with discogenic back pain only: Secondary | ICD-10-CM

## 2023-11-19 DIAGNOSIS — E785 Hyperlipidemia, unspecified: Secondary | ICD-10-CM

## 2023-11-19 DIAGNOSIS — Z79899 Other long term (current) drug therapy: Secondary | ICD-10-CM

## 2023-11-19 MED ORDER — WEGOVY 2.4 MG/0.75ML ~~LOC~~ SOAJ: 2.4000 mg | SUBCUTANEOUS | 1 refills | Status: DC

## 2023-11-19 NOTE — Progress Notes (Signed)
 Established Patient Office Visit  Subjective   Patient ID: Jonathan Shaw, male    DOB: 1970-10-26  Age: 53 y.o. MRN: [id number]  Chief Complaint  Patient presents with   Medical Management of Chronic Issues    6 months for weight management     HPI Pt is a 53 yo male with hx of obesity, dyslipidemia and lumbar DDD who presents to the clinic for medication refills.   He is doing great on wegovy . No side effects.  He has lost 50lbs overall on wegovy  and starting to maintain now. He is running 30 miles a week. He is training for half marathon. He is eating clean and avoiding binging. He gets 80g of protein and creatinine a day. He feels great. Chronic low back pain resolved with weight loss.   Patient Active Problem List   Diagnosis Date Noted   Normocytic anemia 05/23/2023   Overweight (BMI 25.0-29.9) 05/28/2022   History of obesity 05/28/2022   Right lower quadrant abdominal pain 01/23/2021   Abdominal bloating 01/23/2021   Hematuria 01/23/2021   DDD (degenerative disc disease), lumbar 07/03/2020   Chronic right-sided low back pain with right-sided sciatica 06/23/2020   Microcytic anemia 06/25/2019   Snoring 03/19/2019   Non-recurrent unilateral inguinal hernia without obstruction or gangrene 07/28/2018   Diverticulosis 07/28/2018   Elevated fasting glucose 02/21/2016   Constipation 11/21/2015   Heel pain 11/21/2015   Dyslipidemia (high LDL; low HDL) 10/13/2015   Hyperlipidemia 10/14/2014   Seasonal allergies 10/11/2014   Class 1 obesity due to excess calories without serious comorbidity with body mass index (BMI) of 33.0 to 33.9 in adult 10/11/2014   Abnormal weight gain 10/11/2014   Past Medical History:  Diagnosis Date   High cholesterol    Past Surgical History:  Procedure Laterality Date   APPENDECTOMY     COLONOSCOPY  09/06/2019   UPPER GASTROINTESTINAL ENDOSCOPY  09/06/2019   Family History  Adopted: Yes  Problem Relation Age of Onset   Other Mother     Other Father    Colon cancer Neg Hx    Esophageal cancer Neg Hx    Rectal cancer Neg Hx    Stomach cancer Neg Hx    Allergies  Allergen Reactions   Ondansetron     Other reaction(s): Vomiting      Review of Systems  All other systems reviewed and are negative.     Objective:     BP 105/86   Pulse 86   Ht 5\' 11"  (1.803 m)   Wt 173 lb 12.8 oz (78.8 kg)   SpO2 99%   BMI 24.24 kg/m  BP Readings from Last 3 Encounters:  11/19/23 105/86  08/28/23 120/86  05/21/23 127/78   Wt Readings from Last 3 Encounters:  11/19/23 173 lb 12.8 oz (78.8 kg)  08/28/23 172 lb (78 kg)  05/21/23 175 lb 3.2 oz (79.5 kg)      Physical Exam Constitutional:      Appearance: Normal appearance.  HENT:     Head: Normocephalic.  Cardiovascular:     Rate and Rhythm: Normal rate and regular rhythm.  Pulmonary:     Effort: Pulmonary effort is normal.     Breath sounds: Normal breath sounds.  Neurological:     General: No focal deficit present.     Mental Status: He is alert.      The 10-year ASCVD risk score (Arnett DK, et al., 2019) is: 1.4%    Assessment & Plan:  Jonathan AasAaron Aas  Shaw was seen today for medical management of chronic issues.  Diagnoses and all orders for this visit:  History of obesity -     Semaglutide -Weight Management (WEGOVY ) 2.4 MG/0.75ML SOAJ; Inject 2.4 mg into the skin once a week.  Dyslipidemia (high LDL; low HDL) -     Semaglutide -Weight Management (WEGOVY ) 2.4 MG/0.75ML SOAJ; Inject 2.4 mg into the skin once a week.  Medication management  Degeneration of intervertebral disc of lumbar region with discogenic back pain -     Semaglutide -Weight Management (WEGOVY ) 2.4 MG/0.75ML SOAJ; Inject 2.4 mg into the skin once a week.   Down over 50lbs total Maintained weight today Chronic low back pain resolved with weight loss Continue with healthy diet and exercise Labs UTD.  Continue with wegovy  weekly.    Return in about 6 months (around 05/20/2024).    Emylia Latella, PA-C

## 2023-12-18 ENCOUNTER — Other Ambulatory Visit: Payer: Self-pay | Admitting: Physician Assistant

## 2023-12-18 DIAGNOSIS — M51369 Other intervertebral disc degeneration, lumbar region without mention of lumbar back pain or lower extremity pain: Secondary | ICD-10-CM

## 2024-01-15 ENCOUNTER — Other Ambulatory Visit: Payer: Self-pay | Admitting: Physician Assistant

## 2024-01-15 DIAGNOSIS — M51369 Other intervertebral disc degeneration, lumbar region without mention of lumbar back pain or lower extremity pain: Secondary | ICD-10-CM

## 2024-02-16 ENCOUNTER — Other Ambulatory Visit: Payer: Self-pay | Admitting: Physician Assistant

## 2024-02-16 DIAGNOSIS — M51369 Other intervertebral disc degeneration, lumbar region without mention of lumbar back pain or lower extremity pain: Secondary | ICD-10-CM

## 2024-02-17 ENCOUNTER — Encounter: Payer: Self-pay | Admitting: Sports Medicine

## 2024-03-20 ENCOUNTER — Other Ambulatory Visit: Payer: Self-pay | Admitting: Physician Assistant

## 2024-03-20 DIAGNOSIS — M51369 Other intervertebral disc degeneration, lumbar region without mention of lumbar back pain or lower extremity pain: Secondary | ICD-10-CM

## 2024-03-30 ENCOUNTER — Other Ambulatory Visit: Payer: Self-pay | Admitting: Physician Assistant

## 2024-03-30 DIAGNOSIS — E785 Hyperlipidemia, unspecified: Secondary | ICD-10-CM

## 2024-04-24 ENCOUNTER — Other Ambulatory Visit: Payer: Self-pay | Admitting: Physician Assistant

## 2024-04-24 DIAGNOSIS — M51369 Other intervertebral disc degeneration, lumbar region without mention of lumbar back pain or lower extremity pain: Secondary | ICD-10-CM

## 2024-05-19 ENCOUNTER — Ambulatory Visit: Admitting: Physician Assistant

## 2024-05-19 ENCOUNTER — Encounter: Payer: Self-pay | Admitting: Physician Assistant

## 2024-05-19 VITALS — BP 125/86 | HR 76 | Ht 71.0 in | Wt 174.0 lb

## 2024-05-19 DIAGNOSIS — M5136 Other intervertebral disc degeneration, lumbar region with discogenic back pain only: Secondary | ICD-10-CM

## 2024-05-19 DIAGNOSIS — E785 Hyperlipidemia, unspecified: Secondary | ICD-10-CM | POA: Diagnosis not present

## 2024-05-19 DIAGNOSIS — Z131 Encounter for screening for diabetes mellitus: Secondary | ICD-10-CM

## 2024-05-19 DIAGNOSIS — G8929 Other chronic pain: Secondary | ICD-10-CM

## 2024-05-19 DIAGNOSIS — Z8639 Personal history of other endocrine, nutritional and metabolic disease: Secondary | ICD-10-CM | POA: Diagnosis not present

## 2024-05-19 DIAGNOSIS — M25562 Pain in left knee: Secondary | ICD-10-CM

## 2024-05-19 DIAGNOSIS — Z125 Encounter for screening for malignant neoplasm of prostate: Secondary | ICD-10-CM | POA: Diagnosis not present

## 2024-05-19 DIAGNOSIS — Z0000 Encounter for general adult medical examination without abnormal findings: Secondary | ICD-10-CM | POA: Diagnosis not present

## 2024-05-19 DIAGNOSIS — M51369 Other intervertebral disc degeneration, lumbar region without mention of lumbar back pain or lower extremity pain: Secondary | ICD-10-CM

## 2024-05-19 DIAGNOSIS — M25561 Pain in right knee: Secondary | ICD-10-CM

## 2024-05-19 DIAGNOSIS — Z79899 Other long term (current) drug therapy: Secondary | ICD-10-CM

## 2024-05-19 DIAGNOSIS — D509 Iron deficiency anemia, unspecified: Secondary | ICD-10-CM | POA: Diagnosis not present

## 2024-05-19 NOTE — Patient Instructions (Signed)
 Health Maintenance, Male  Adopting a healthy lifestyle and getting preventive care are important in promoting health and wellness. Ask your health care provider about:  The right schedule for you to have regular tests and exams.  Things you can do on your own to prevent diseases and keep yourself healthy.  What should I know about diet, weight, and exercise?  Eat a healthy diet    Eat a diet that includes plenty of vegetables, fruits, low-fat dairy products, and lean protein.  Do not eat a lot of foods that are high in solid fats, added sugars, or sodium.  Maintain a healthy weight  Body mass index (BMI) is a measurement that can be used to identify possible weight problems. It estimates body fat based on height and weight. Your health care provider can help determine your BMI and help you achieve or maintain a healthy weight.  Get regular exercise  Get regular exercise. This is one of the most important things you can do for your health. Most adults should:  Exercise for at least 150 minutes each week. The exercise should increase your heart rate and make you sweat (moderate-intensity exercise).  Do strengthening exercises at least twice a week. This is in addition to the moderate-intensity exercise.  Spend less time sitting. Even light physical activity can be beneficial.  Watch cholesterol and blood lipids  Have your blood tested for lipids and cholesterol at 51 years of age, then have this test every 5 years.  You may need to have your cholesterol levels checked more often if:  Your lipid or cholesterol levels are high.  You are older than 51 years of age.  You are at high risk for heart disease.  What should I know about cancer screening?  Many types of cancers can be detected early and may often be prevented. Depending on your health history and family history, you may need to have cancer screening at various ages. This may include screening for:  Colorectal cancer.  Prostate cancer.  Skin cancer.  Lung  cancer.  What should I know about heart disease, diabetes, and high blood pressure?  Blood pressure and heart disease  High blood pressure causes heart disease and increases the risk of stroke. This is more likely to develop in people who have high blood pressure readings or are overweight.  Talk with your health care provider about your target blood pressure readings.  Have your blood pressure checked:  Every 3-5 years if you are 24-52 years of age.  Every year if you are 3 years old or older.  If you are between the ages of 60 and 72 and are a current or former smoker, ask your health care provider if you should have a one-time screening for abdominal aortic aneurysm (AAA).  Diabetes  Have regular diabetes screenings. This checks your fasting blood sugar level. Have the screening done:  Once every three years after age 66 if you are at a normal weight and have a low risk for diabetes.  More often and at a younger age if you are overweight or have a high risk for diabetes.  What should I know about preventing infection?  Hepatitis B  If you have a higher risk for hepatitis B, you should be screened for this virus. Talk with your health care provider to find out if you are at risk for hepatitis B infection.  Hepatitis C  Blood testing is recommended for:  Everyone born from [birth year] through 1963.  Anyone  with known risk factors for hepatitis C.  Sexually transmitted infections (STIs)  You should be screened each year for STIs, including gonorrhea and chlamydia, if:  You are sexually active and are younger than 51 years of age.  You are older than 51 years of age and your health care provider tells you that you are at risk for this type of infection.  Your sexual activity has changed since you were last screened, and you are at increased risk for chlamydia or gonorrhea. Ask your health care provider if you are at risk.  Ask your health care provider about whether you are at high risk for HIV. Your health care provider  may recommend a prescription medicine to help prevent HIV infection. If you choose to take medicine to prevent HIV, you should first get tested for HIV. You should then be tested every 3 months for as long as you are taking the medicine.  Follow these instructions at home:  Alcohol use  Do not drink alcohol if your health care provider tells you not to drink.  If you drink alcohol:  Limit how much you have to 0-2 drinks a day.  Know how much alcohol is in your drink. In the [place] one drink equals one 12 oz bottle of beer (355 mL), one 5 oz glass of wine (148 mL), or one 1 oz glass of hard liquor (44 mL).  Lifestyle  Do not use any products that contain nicotine or tobacco. These products include cigarettes, chewing tobacco, and vaping devices, such as e-cigarettes. If you need help quitting, ask your health care provider.  Do not use street drugs.  Do not share needles.  Ask your health care provider for help if you need support or information about quitting drugs.  General instructions  Schedule regular health, dental, and eye exams.  Stay current with your vaccines.  Tell your health care provider if:  You often feel depressed.  You have ever been abused or do not feel safe at home.  Summary  Adopting a healthy lifestyle and getting preventive care are important in promoting health and wellness.  Follow your health care provider's instructions about healthy diet, exercising, and getting tested or screened for diseases.  Follow your health care provider's instructions on monitoring your cholesterol and blood pressure.  This information is not intended to replace advice given to you by your health care provider. Make sure you discuss any questions you have with your health care provider.  Document Revised: 10/23/2020 Document Reviewed: 10/23/2020  Elsevier Patient Education  2024 [employer].

## 2024-05-19 NOTE — Progress Notes (Signed)
 Complete physical exam  Patient: Jonathan Shaw   DOB: 06-21-1970   53 y.o. Male  MRN: [id number]  Subjective:    Chief Complaint  Patient presents with   Medical Management of Chronic Issues   .SABRADiscussed the use of AI scribe software for clinical note transcription with the patient, who gave verbal consent to proceed.  History of Present Illness Jonathan Shaw is a 53 year old male who presents to the clinic for CPE.   Knee pain and lower extremity symptoms - Knee pain, particularly during lunges - Associated clicking sensation and weakness in the knee - Pain described as 'bone on bone' - Occasional discomfort at the top of the foot - Symptoms are intermittent and vary with activity  Physical activity and exercise tolerance - Participated in a 'four by four by twenty four' running challenge (four miles every four hours for twenty-four hours) - Actively tracks mileage and counts calories since January - No significant back pain despite intense physical activity  Weight management and nutritional intake - Concerned about maintaining weight - Consumes protein shakes to increase calorie intake, especially on active days - Finds it challenging to consume enough calories during periods of high activity  Medication and supplement use - Currently taking Wegovy  2.4 mg, with no issues obtaining prescriptions - Takes L-theanine and magnesium to aid with sleep  General health and review of systems - Sleep is going well - No chest pain - No significant recent illness - Bowel movements are regular     Most recent fall risk assessment:    05/24/2024    7:09 AM  Fall Risk   Falls in the past year? 0  Number falls in past yr: 0  Injury with Fall? 0  Follow up Falls evaluation completed     Most recent depression screenings:    11/19/2023    7:38 AM 05/28/2022   11:48 AM  PHQ 2/9 Scores  PHQ - 2 Score 0 0    Vision:Within last year and Dental: No current dental problems  and Receives regular dental care  Patient Active Problem List   Diagnosis Date Noted   Chronic pain of both knees 05/24/2024   Normocytic anemia 05/23/2023   Overweight (BMI 25.0-29.9) 05/28/2022   History of obesity 05/28/2022   Right lower quadrant abdominal pain 01/23/2021   Abdominal bloating 01/23/2021   Hematuria 01/23/2021   DDD (degenerative disc disease), lumbar 07/03/2020   Chronic right-sided low back pain with right-sided sciatica 06/23/2020   Microcytic anemia 06/25/2019   Snoring 03/19/2019   Non-recurrent unilateral inguinal hernia without obstruction or gangrene 07/28/2018   Diverticulosis 07/28/2018   Elevated fasting glucose 02/21/2016   Constipation 11/21/2015   Heel pain 11/21/2015   Dyslipidemia (high LDL; low HDL) 10/13/2015   Hyperlipidemia 10/14/2014   Seasonal allergies 10/11/2014   Class 1 obesity due to excess calories without serious comorbidity with body mass index (BMI) of 33.0 to 33.9 in adult 10/11/2014   Abnormal weight gain 10/11/2014   Past Medical History:  Diagnosis Date   High cholesterol    Past Surgical History:  Procedure Laterality Date   APPENDECTOMY     COLONOSCOPY  09/06/2019   UPPER GASTROINTESTINAL ENDOSCOPY  09/06/2019   Allergies  Allergen Reactions   Ondansetron     Other reaction(s): Vomiting      Patient Care Team: Sallie Staron L, PA-C as PCP - General (Family Medicine)   Outpatient Medications Prior to Visit  Medication Sig  atorvastatin  (LIPITOR) 80 MG tablet TAKE 1 TABLET (80 MG TOTAL) BY MOUTH DAILY. LABS FOR REFILLS   cetirizine  (ZYRTEC ) 5 MG tablet Take 1 tablet (5 mg total) by mouth daily as needed for allergies.   cyclobenzaprine  (FLEXERIL ) 10 MG tablet TAKE 1 TABLET BY MOUTH THREE TIMES A DAY   fluticasone  (FLONASE ) 50 MCG/ACT nasal spray SPRAY 2 SPRAYS INTO EACH NOSTRIL EVERY DAY   lidocaine  (LIDODERM ) 5 % Remove & Discard patch within 12 hours or as directed by MD   Multiple Vitamin (MULTIVITAMIN)  tablet Take 1 tablet by mouth daily.   Semaglutide -Weight Management (WEGOVY ) 2.4 MG/0.75ML SOAJ Inject 2.4 mg into the skin once a week.   vitamin B-12 (CYANOCOBALAMIN) 500 MCG tablet Take 500 mcg by mouth daily.   vitamin C (ASCORBIC ACID) 500 MG tablet Take 500 mg by mouth daily.   No facility-administered medications prior to visit.    Review of Systems  All other systems reviewed and are negative.         Objective:     BP 125/86   Pulse 76   Ht 5' 11 (1.803 m)   Wt 174 lb (78.9 kg)   SpO2 99%   BMI 24.27 kg/m  BP Readings from Last 3 Encounters:  05/19/24 125/86  11/19/23 105/86  08/28/23 120/86   Wt Readings from Last 3 Encounters:  05/19/24 174 lb (78.9 kg)  11/19/23 173 lb 12.8 oz (78.8 kg)  08/28/23 172 lb (78 kg)      Physical Exam  BP 125/86   Pulse 76   Ht 5' 11 (1.803 m)   Wt 174 lb (78.9 kg)   SpO2 99%   BMI 24.27 kg/m   General Appearance:    Alert, cooperative, no distress, appears stated age  Head:    Normocephalic, without obvious abnormality, atraumatic  Eyes:    PERRL, conjunctiva/corneas clear, EOM's intact, fundi    benign, both eyes       Ears:    Normal TM's and external ear canals, both ears  Nose:   Nares normal, septum midline, mucosa normal, no drainage    or sinus tenderness  Throat:   Lips, mucosa, and tongue normal; teeth and gums normal  Neck:   Supple, symmetrical, trachea midline, no adenopathy;       thyroid:  No enlargement/tenderness/nodules; no carotid   bruit or JVD  Back:     Symmetric, no curvature, ROM normal, no CVA tenderness  Lungs:     Clear to auscultation bilaterally, respirations unlabored  Chest wall:    No tenderness or deformity  Heart:    Regular rate and rhythm, S1 and S2 normal, no murmur, rub   or gallop  Abdomen:     Soft, non-tender, bowel sounds active all four quadrants,    no masses, no organomegaly        Extremities:   Extremities normal, atraumatic, no cyanosis or edema  Pulses:   2+  and symmetric all extremities  Skin:   Skin color, texture, turgor normal, no rashes or lesions  Lymph nodes:   Cervical, supraclavicular, and axillary nodes normal  Neurologic:   CNII-XII intact. Normal strength, sensation and reflexes      throughout      Assessment & Plan:    Routine Health Maintenance and Physical Exam  Immunization History  Administered Date(s) Administered   Influenza, Seasonal, Injecte, Preservative Fre 03/01/2018   Influenza,inj,Quad PF,6+ Mos 02/25/2019, 03/29/2020   Influenza-Unspecified 03/17/2014, 03/05/2016, 03/10/2018, 02/25/2019, 03/29/2020,  03/06/2021, 03/16/2022, 03/18/2023   PFIZER Comirnaty(Gray Top)Covid-19 Tri-Sucrose Vaccine 08/11/2019, 09/01/2019, 04/05/2020, 05/21/2021   PFIZER(Purple Top)SARS-COV-2 Vaccination 04/05/2020, 05/21/2021   Tdap 10/11/2014   Unspecified SARS-COV-2 Vaccination 08/11/2019, 09/01/2019, 04/05/2020, 05/21/2021    Health Maintenance  Topic Date Due   Hepatitis C Screening  Never done   COVID-19 Vaccine (9 - 2025-26 season) 06/04/2024 (Originally 02/16/2024)   Zoster Vaccines- Shingrix (1 of 2) 08/17/2024 (Originally 10/20/1991)   Influenza Vaccine  09/14/2024 (Originally 01/16/2024)   Pneumococcal Vaccine: 50+ Years (1 of 1 - PCV) 05/19/2025 (Originally 10/20/2022)   Hepatitis B Vaccines 19-59 Average Risk (1 of 3 - 19+ 3-dose series) 05/19/2025 (Originally 10/20/1991)   DTaP/Tdap/Td (2 - Td or Tdap) 10/10/2024   Colonoscopy  09/05/2029   HIV Screening  Completed   HPV VACCINES  Aged Out   Meningococcal B Vaccine  Aged Out    Discussed health benefits of physical activity, and encouraged him to engage in regular exercise appropriate for his age and condition.  SABRAJacqulyne was seen today for medical management of chronic issues.  Diagnoses and all orders for this visit:  Routine physical examination  Degeneration of intervertebral disc of lumbar region, unspecified whether pain present  Dyslipidemia (high LDL; low HDL) -      Lipid panel  History of obesity  Medication management -     CBC with Differential/Platelet -     CMP14+EGFR -     Lipid panel -     PSA, total and free -     VITAMIN D  25 Hydroxy (Vit-D Deficiency, Fractures) -     Fe+TIBC+Fer -     TSH  Prostate cancer screening -     PSA, total and free  Screening for diabetes mellitus -     CMP14+EGFR  Microcytic anemia -     CBC with Differential/Platelet -     Fe+TIBC+Fer  Chronic pain of both knees -     DG Knee Bilateral Standing AP; Future   Assessment & Plan History of Obesity, BMI to goal Management with Wegovy  2.4 mg weekly is ongoing. He is actively counting calories and increasing physical activity, including running. Reports decreased appetite, which is managed by taking the medication every other week. - Continue Wegovy  2.4 mg weekly. - Encouraged increased caloric intake on active days to support physical activity. - Continue monitoring weight and appetite.  Right knee pain, possible chondromalacia and arthritis Right knee pain with possible chondromalacia and arthritis. Reports clicking and weakness during lunges, suggesting possible joint issues. Differential includes chondromalacia and arthritis. Running may exacerbate symptoms. - Ordered x-ray of the right knee to assess for arthritis. - Recommended Voltaren gel for inflammation management before and after runs. - Advised on muscle strengthening exercises to support the joint. - Encouraged listening to body cues and taking breaks if pain occurs.  General Health Maintenance Routine health maintenance discussed, including lab work and dental visits. Vitals look great.  [facility] labs for routine monitoring. - colonoscopy UTD - PSA screening ordered today - Flu/covid/shingles vaccines declined   Return in about 6 months (around 11/17/2024).     Quantavis Obryant, PA-C

## 2024-05-20 LAB — CBC WITH DIFFERENTIAL/PLATELET
Basophils Absolute: 0 x10E3/uL (ref 0.0–0.2)
Basos: 1 %
EOS (ABSOLUTE): 0.1 x10E3/uL (ref 0.0–0.4)
Eos: 3 %
Hematocrit: 43.6 % (ref 37.5–51.0)
Hemoglobin: 15 g/dL (ref 13.0–17.7)
Immature Grans (Abs): 0 x10E3/uL (ref 0.0–0.1)
Immature Granulocytes: 0 %
Lymphocytes Absolute: 1.3 x10E3/uL (ref 0.7–3.1)
Lymphs: 27 %
MCH: 32 pg (ref 26.6–33.0)
MCHC: 34.4 g/dL (ref 31.5–35.7)
MCV: 93 fL (ref 79–97)
Monocytes Absolute: 0.4 x10E3/uL (ref 0.1–0.9)
Monocytes: 9 %
Neutrophils Absolute: 2.9 x10E3/uL (ref 1.4–7.0)
Neutrophils: 60 %
Platelets: 349 x10E3/uL (ref 150–450)
RBC: 4.69 x10E6/uL (ref 4.14–5.80)
RDW: 12.3 % (ref 11.6–15.4)
WBC: 4.9 x10E3/uL (ref 3.4–10.8)

## 2024-05-20 LAB — CMP14+EGFR
ALT: 50 IU/L — ABNORMAL HIGH (ref 0–44)
AST: 35 IU/L (ref 0–40)
Albumin: 5 g/dL — ABNORMAL HIGH (ref 3.8–4.9)
Alkaline Phosphatase: 78 IU/L (ref 47–123)
BUN/Creatinine Ratio: 16 (ref 9–20)
BUN: 15 mg/dL (ref 6–24)
Bilirubin Total: 0.5 mg/dL (ref 0.0–1.2)
CO2: 21 mmol/L (ref 20–29)
Calcium: 10.3 mg/dL — ABNORMAL HIGH (ref 8.7–10.2)
Chloride: 99 mmol/L (ref 96–106)
Creatinine, Ser: 0.93 mg/dL (ref 0.76–1.27)
Globulin, Total: 2.3 g/dL (ref 1.5–4.5)
Glucose: 85 mg/dL (ref 70–99)
Potassium: 4.9 mmol/L (ref 3.5–5.2)
Sodium: 140 mmol/L (ref 134–144)
Total Protein: 7.3 g/dL (ref 6.0–8.5)
eGFR: 99 mL/min/1.73 (ref >59)

## 2024-05-20 LAB — LIPID PANEL
Chol/HDL Ratio: 2.4 ratio (ref 0.0–5.0)
Cholesterol, Total: 139 mg/dL (ref 100–199)
HDL: 58 mg/dL (ref >39)
LDL Chol Calc (NIH): 62 mg/dL (ref 0–99)
Triglycerides: 103 mg/dL (ref 0–149)
VLDL Cholesterol Cal: 19 mg/dL (ref 5–40)

## 2024-05-20 LAB — PSA, TOTAL AND FREE
PSA, Free Pct: 51.5 %
PSA, Free: 0.67 ng/mL
Prostate Specific Ag, Serum: 1.3 ng/mL (ref 0.0–4.0)

## 2024-05-20 LAB — IRON,TIBC AND FERRITIN PANEL
Ferritin: 35 ng/mL (ref 30–400)
Iron Saturation: 26 % (ref 15–55)
Iron: 93 ug/dL (ref 38–169)
Total Iron Binding Capacity: 352 ug/dL (ref 250–450)
UIBC: 259 ug/dL (ref 111–343)

## 2024-05-20 LAB — TSH: TSH: 1.38 u[IU]/mL (ref 0.450–4.500)

## 2024-05-20 LAB — VITAMIN D 25 HYDROXY (VIT D DEFICIENCY, FRACTURES): Vit D, 25-Hydroxy: 83.7 ng/mL (ref 30.0–100.0)

## 2024-05-21 ENCOUNTER — Ambulatory Visit: Payer: Self-pay | Admitting: Physician Assistant

## 2024-05-21 NOTE — Progress Notes (Signed)
 Aleksa,   Thyroid looks great.  Iron looks good.  Vitamin D  looks great.  Prostate normal.  Cholesterol looks fabulous.  Albumin a little high. How many grams of protein are you getting a day? Aim for 60-80g no more.  Calcium  just little high, no supplements with calcium  in them.

## 2024-05-24 ENCOUNTER — Other Ambulatory Visit: Payer: Self-pay | Admitting: Physician Assistant

## 2024-05-24 ENCOUNTER — Encounter: Payer: Self-pay | Admitting: Physician Assistant

## 2024-05-24 DIAGNOSIS — G8929 Other chronic pain: Secondary | ICD-10-CM

## 2024-05-24 MED ORDER — WEGOVY 2.4 MG/0.75ML ~~LOC~~ SOAJ: 2.4000 mg | SUBCUTANEOUS | 1 refills | Status: AC

## 2024-05-24 MED ORDER — ATORVASTATIN CALCIUM 80 MG PO TABS: 80.0000 mg | ORAL_TABLET | Freq: Every day | ORAL | 3 refills | Status: AC

## 2024-05-26 ENCOUNTER — Other Ambulatory Visit: Payer: Self-pay | Admitting: Physician Assistant

## 2024-05-26 DIAGNOSIS — M51369 Other intervertebral disc degeneration, lumbar region without mention of lumbar back pain or lower extremity pain: Secondary | ICD-10-CM

## 2024-06-26 ENCOUNTER — Other Ambulatory Visit: Payer: Self-pay | Admitting: Physician Assistant

## 2024-06-26 DIAGNOSIS — M51369 Other intervertebral disc degeneration, lumbar region without mention of lumbar back pain or lower extremity pain: Secondary | ICD-10-CM

## 2024-11-17 ENCOUNTER — Ambulatory Visit: Admitting: Physician Assistant
